# Patient Record
Sex: Male | Born: 1940 | Race: White | Hispanic: No | Marital: Married | Smoking: Former smoker
Health system: Southern US, Community
[De-identification: ages and names within clinical notes are randomized; demographics above are authoritative.]

## PROBLEM LIST (undated history)

## (undated) DIAGNOSIS — E785 Hyperlipidemia, unspecified: Secondary | ICD-10-CM

## (undated) DIAGNOSIS — I2510 Atherosclerotic heart disease of native coronary artery without angina pectoris: Secondary | ICD-10-CM

## (undated) DIAGNOSIS — Z720 Tobacco use: Secondary | ICD-10-CM

## (undated) DIAGNOSIS — I213 ST elevation (STEMI) myocardial infarction of unspecified site: Secondary | ICD-10-CM

## (undated) DIAGNOSIS — I10 Essential (primary) hypertension: Secondary | ICD-10-CM

## (undated) DIAGNOSIS — Z95820 Peripheral vascular angioplasty status with implants and grafts: Secondary | ICD-10-CM

## (undated) DIAGNOSIS — J449 Chronic obstructive pulmonary disease, unspecified: Secondary | ICD-10-CM

## (undated) DIAGNOSIS — Z9861 Coronary angioplasty status: Secondary | ICD-10-CM

## (undated) DIAGNOSIS — C801 Malignant (primary) neoplasm, unspecified: Secondary | ICD-10-CM

## (undated) DIAGNOSIS — C61 Malignant neoplasm of prostate: Secondary | ICD-10-CM

## (undated) HISTORY — DX: Atherosclerotic heart disease of native coronary artery without angina pectoris: I25.10

## (undated) HISTORY — DX: Coronary angioplasty status: Z98.61

## (undated) HISTORY — DX: Chronic obstructive pulmonary disease, unspecified: J44.9

## (undated) HISTORY — DX: Malignant neoplasm of prostate: C61

## (undated) HISTORY — DX: ST elevation (STEMI) myocardial infarction of unspecified site: I21.3

## (undated) HISTORY — DX: Hyperlipidemia, unspecified: E78.5

---

## 2000-03-20 ENCOUNTER — Encounter: Payer: Self-pay | Admitting: *Deleted

## 2000-03-20 ENCOUNTER — Encounter: Admission: RE | Admit: 2000-03-20 | Discharge: 2000-03-20 | Payer: Self-pay | Admitting: *Deleted

## 2003-09-06 DIAGNOSIS — C61 Malignant neoplasm of prostate: Secondary | ICD-10-CM

## 2003-09-06 HISTORY — DX: Malignant neoplasm of prostate: C61

## 2003-12-15 ENCOUNTER — Encounter (INDEPENDENT_AMBULATORY_CARE_PROVIDER_SITE_OTHER): Payer: Self-pay | Admitting: *Deleted

## 2003-12-15 ENCOUNTER — Inpatient Hospital Stay (HOSPITAL_COMMUNITY): Admission: RE | Admit: 2003-12-15 | Discharge: 2003-12-18 | Payer: Self-pay | Admitting: Urology

## 2003-12-15 HISTORY — PX: PROSTATECTOMY: SHX69

## 2003-12-23 ENCOUNTER — Ambulatory Visit (HOSPITAL_COMMUNITY): Admission: RE | Admit: 2003-12-23 | Discharge: 2003-12-23 | Payer: Self-pay | Admitting: Urology

## 2004-02-12 ENCOUNTER — Encounter: Admission: RE | Admit: 2004-02-12 | Discharge: 2004-02-12 | Payer: Self-pay | Admitting: Internal Medicine

## 2004-03-29 ENCOUNTER — Encounter: Admission: RE | Admit: 2004-03-29 | Discharge: 2004-03-29 | Payer: Self-pay | Admitting: Internal Medicine

## 2005-09-05 HISTORY — PX: COLONOSCOPY: SHX174

## 2009-06-02 ENCOUNTER — Ambulatory Visit: Admission: RE | Admit: 2009-06-02 | Discharge: 2009-08-31 | Payer: Self-pay | Admitting: Radiation Oncology

## 2009-06-08 LAB — CREATININE, SERUM: Creatinine, Ser: 1.13 mg/dL (ref 0.40–1.50)

## 2009-06-10 ENCOUNTER — Ambulatory Visit (HOSPITAL_COMMUNITY): Admission: RE | Admit: 2009-06-10 | Discharge: 2009-06-10 | Payer: Self-pay | Admitting: Radiation Oncology

## 2011-01-21 NOTE — Op Note (Signed)
NAME:  DANELL, VERNO                    ACCOUNT NO.:  [id number]   MEDICAL RECORD NO.:  [id number]                   PATIENT TYPE:  INP   LOCATION:  0373                                 FACILITY:  [facility]   PHYSICIAN:  Mark C. Vernie Ammons, M.D.               DATE OF BIRTH:  1941-02-01   DATE OF PROCEDURE:  12/15/2003  DATE OF DISCHARGE:                                 OPERATIVE REPORT   PREOPERATIVE DIAGNOSIS:  Prostate cancer.   POSTOPERATIVE DIAGNOSIS:  Prostate cancer.   PROCEDURE PERFORMED:  Nerve-sparing radical retropubic prostatectomy with  bilateral pelvic lymph node dissection.   SURGEON:  Mark C. Vernie Ammons, M.D.   ASSISTANT:  Thyra Breed, MD   ANESTHESIA:  General endotracheal.   ESTIMATED BLOOD LOSS:  850 cc.   DRAINS:  A 22 French Foley catheter straight drain and a 10 [place] Blake  drain to bulb-suction.   COMPLICATIONS:  None.   INDICATIONS FOR PROCEDURE:  Mr. Maiolo is a very pleasant 70 year old  male evaluated for an elevated PSA found on a routine physical examination  in January, 2005.  Specifically, the level was found to be 5.03.  The  patient denies any history of lower urinary tract symptoms.  The patient  denies any erectile dysfunction; however, he and his wife have not been  sexually active for the past 10 years.  He has no issues with incontinence.   PHYSICAL EXAMINATION:  The patient did have a palpable prostatic nodule at  the right apex; therefore, a transrectal ultrasound-guided prostate biopsy  was performed, which provided evidence of a Gleason 3+3=6 adenocarcinoma of  the prostate in 50% of the tissue from the right.  From the left, a Gleason  3+4=7 adenocarcinoma of the prostate involving approximately 50%.  Perineural invasion was identified.  Several treatment plans were discussed  with the patient, including radiation, implantation of radioactive seeds as  well as surgical therapy.  The patient has elected to proceed with radical  retropubic nerve-sparing prostatectomy.  The risks, benefits and  alternatives of the procedure have been explained to the patient, and he is  willing to proceed.   PROCEDURE IN DETAIL:  Following identification of his arm bracelet, the  patient was brought to the operating room and placed in the supine position.  The patient received preoperative IV antibiotics and underwent general  endotracheal anesthesia.  Bilateral sequential compression stockings were  applied, and a bump was placed under the low back.  The lower abdomen was  then shaved.  This area was then prepped with a Betadine solution and draped  in the usual sterile fashion.  A 20 French Foley catheter was then inserted,  and the bladder was drained.  An initial incision was made with the scalpel  in the midline from the pubis to just below the umbilicus.  This was carried  down through the subcutaneous tissues to the level of the fascia.  Any  subcutaneous bleeding  was controlled with Bovie electrocautery.  The scalpel  was then used to incise the anterior rectus fascia.  The rectus muscle was  then identified.  An attempt was made to part the muscle in the midline;  however, a portion of the rectus muscle was transected from its tendinis  attachment at the pubis on the right.  The transversalis fascia was then  opened, and blunt dissection was used to expose both the right and the left  pelvic fossa.  A malleable retractor was then used for visualization within  the right iliac fossa.  The malleable retractor was attached to the self-  retaining Bookwalter retractor.  We then began our dissection of the right  side of the lymph node packet, which was initiated over the right iliac  vein.  The rectal lymph node dissection was then completed, using Metzenbaum  scissors and DeBakey forceps with the limits of the dissection being the  external iliac vein, the obturator nerve, the circumflex iliac vein, and the  bifurcation of  the iliac artery.  Large metal hemalock clips were then used  to control vascular and lymphatic channels.  The obturator nerve was  protected during the procedure.  There was no obvious gross nodal disease.  A Ray-Tec sponge was used to pack the right iliac fossa following removal of  the right-sided lymph node packet.  The malleable retractor blades were then  reset, and the left-sided node dissection was performed in a similar fashion  once again using hemalock clips for control of any lymphatic vascular  channels.  Once the dissections were complete, and there was no obvious  gross nodal disease, we began our dissection of the prostate.  The  retractors were then readjusted.  Using a hand-held Stamey retractor, the  endopelvic fascia was easily identified and punctured on the right.  A right  angle was used to dissect the lateral aspect of the prostate free from the  overlying endopelvic fascia.  Blunt dissection was then used to further free  the lateral aspect of the right prostate.  We then punctured the endopelvic  fascia on the left, and in a similar fashion using the right angle and blunt  dissection, freed the left lateral aspect of the prostate.  The fat  overlying the dorsal vein complex was then teased away using the Kitner  separate dissection.  The puboprostatic ligaments were then easily  visualized at their attachment to the pubis.  They were then taken down  sharply using scissors at their attachment to the pubis laterally.  Those  ligaments within the midline were left intact.  The McDougall clamp was  placed beneath the dorsal vein complex.  A #1 Vicryl tie was then passed and  tied around the dorsal vein complex.  The McDougall clamp was again passed  in the same plane, and a second #1 Vicryl tie was placed and used to doubly  ligate the dorsal vein complex.  Bovie electrocautery was then used to begin  transection of the dorsal vein complex.  This produced minimal  back- bleeding.  The back-bleeding was controlled using a 2-0 chromic running  suture in a U-shaped fashion at the entry of the dorsal vein complex and at  to the prostatic specimen.  This provided excellent hemostasis from back-  bleeding.  The dorsal vein complex was then further transected.  This  revealed a lower portion of the complex, which continued to bleed.  Therefore, a #2-0 Vicryl on a UR5 needle was  used to over-sew any remaining  bleeding from the dorsal vein complex.  When this was controlled, the apex  of the prostate could be seen easily exposed.  Again, there were a couple of  nodules on the right apex of the prostate.  Metzenbaum scissors were then  spread on either side of the urethra to dissect the neurovascular bundles  from the urethra laterally on each side.  A right angle clamp was then  passed beneath the urethra, and a moistened umbilical tape was used to  elevate the urethra away from the neurovascular bundles.  The anterior  urethra was then divided using a scalpel.  The Foley catheter was then  lubricated and pulled into the wound.  The catheter was then divided and  used to provide cephalad traction on the prostate.  The posterior urethra  was then divided, and the rectourethralis attachments were taken down  bluntly.  There was a small amount of posterior urethra remaining, which was  further transected using Metzenbaum scissors.  The prostate was then  dissected off the rectum bluntly.  The lateral pedicles were then taken  down, using large right angle metal clips.  Once the prostate had been  sufficiently elevated, the anterior leaf of Denonvilliers fascia was excised  overlying the seminal vesicles.  The ampullae of the vas were identified,  dissected out, and ligated using large clips.  Similarly, the seminal  vesicles were dissected out and ligated using large clips.  We then turned  our attention anteriorly where the bladder neck was grasped between  two  Allis clamps.  Using a tonsil clamp at the Bovie, we dissected off the  bladder neck fibers.  The dissection was quite nice, and an excellent plane  between the prostate and the bladder was developed.  The anterior bladder  neck was then divided.  The Foley catheter balloon deflated, and the Foley  catheter brought from the bladder and used to provide traction on the  prostate.  Both ureteral orifices were then identified and seemed to be  effluxing blue urine from the previous administration of indigo carmine.  The posterior bladder neck was then divided along with any remaining  posterior prostatic attachments.  The prostatic specimen was then passed  from the surgical field.  The bladder neck mucosa was then everted using  interrupted 4-0 chromic stitches.  Care was taken to remain well away from  the ureteral orifices.  The residual bladder neck was large enough to admit  only the tip of the fifth surgeon's finger.  At this time, there was some additional bleeding noted near the urethra.  In fact, it appeared to be the  dorsal-most aspect of the dorsal vein complex; therefore, a 2-0 chromic  suture was used in a figure-of-eight fashion to provide excellent hemostasis  of any remaining bleeding in this area.  A Greenwald device was placed in  the urethra, and anastomotic sutures with 2-0 PDS were placed from the  urethral stump to the bladder neck at the 2, 5, 7, and 10 o'clock positions.  A 22 French Foley catheter was then placed through the bladder neck and  guided into the bladder.  The balloon was inflated with approximately 15 ml  of sterile fluid.  We then placed the 12 o'clock anastomotic suture from the  urethral stump to the bladder neck.  It should be mentioned that during the  final dissection of the prostate, a 22 French Foley catheter was inserted  into the urethra,  and traction was used against the urethral stump to  control any bleeding.  At this point, the malleable  retractor was removed,  and the bladder neck was positioned firmly against the urethral stump.  Any  slack in the anastomotic sutures was removed.  While tying the first  anastomotic suture, the suture broke; therefore, the slack was brought back  to the anastomosis, and a new suture placed.  Again, the bladder neck was  positioned firmly against the urethral stump, and all anastomotic sutures  were tied and trimmed.  At this point, the bladder was irrigated, and the  anastomosis was found to be water-tight.  There was no bleeding seen within  the bladder.  A #10 flat fluted Blake drain was then placed under a separate  stab wound lateral to the right side of the abdominal incision.  This was  positioned over the anastomotic area in the pelvic fossa.  All laps and Ray-  Tek sponges were removed from the operative field.  By this time, all  sponge, needle, and instrument counts were correct x2.  The wound was then  copiously irrigated with sterile saline.  The fascia was then closed with a  #1 running PDS suture.  The previously transected belly of the right rectus  muscle was then carefully positioned below the fascia.  The subcutaneous  tissues were then irrigated with sterile saline, and the skin was closed  with surgical clips.  The Foley catheter was irrigated once again, and no  bleeding was noted.  The catheter was placed to straight drain and then on  traction and the J-P drain to bulb-suction.  The patient tolerated the  procedure well, and there were no complications.   Dr. Domenic Schwab was present and participated in the entire procedure, as he  was the responsible surgeon.   DISPOSITION:  After awaking from general anesthesia, the patient was  transported to the post anesthesia care unit in stable condition.  From  here, he will be transferred to the floor for further postoperative  management.     Thyra Breed, MD                            Veverly Fells. Vernie Ammons, M.D.     EG/MEDQ  D:  12/15/2003  T:  12/15/2003  Job:  [id number]

## 2011-01-21 NOTE — Discharge Summary (Signed)
NAME:  Angel West, Angel West                    ACCOUNT NO.:  [id number]   MEDICAL RECORD NO.:  [id number]                   PATIENT TYPE:  INP   LOCATION:  0373                                 FACILITY:  [facility]   PHYSICIAN:  Mark C. Vernie Ammons, M.D.               DATE OF BIRTH:  November 28, 1940   DATE OF ADMISSION:  12/15/2003  DATE OF DISCHARGE:  12/18/2003                                 DISCHARGE SUMMARY   PRIMARY DIAGNOSIS:  Adenocarcinoma of the prostate.   OTHER DIAGNOSES:  1. History of coronary artery disease.  2. Kidney stones.   MAJOR OPERATION:  Radical retropubic prostatectomy, bilateral lymph node  dissection.  The pathology revealed a pT2c, N0, MX Gleason 7 lesion that had  negative margins and negative nodes.   DISPOSITION:  The patient is discharged home in stable, satisfactory, and  improved condition.  He is tolerating a regular diet and has had bowel  movement.  His follow up will be in my office in 1-week for skin staple  removal.  His activity will be limited to no heavy lifting, straining,  driving, or vigorous activity.   DISCHARGE MEDICATIONS:  1. Tylox one to two q.4 h. p.r.n. pain, dispense #40.  2. Cipro 250 mg b.i.d., dispense #18.   BRIEF HISTORY:  The patient is a 70 year old white male who was found to  have an elevated PSA of 5.03.  Subsequent biopsy of his prostate revealed  adenocarcinoma Gleason 7.  We discussed treatment options and he elected for  radical retropubic prostatectomy and was admitted for that.  Full History  and Physical was previously dictated and it noted in the patient's hospital  chart.   HOSPITAL COURSE:  The patient was admitted and underwent a radical  retropubic prostatectomy and bilateral pelvic lymph node dissection without  complication.  The night of surgery was noted to be doing well, slight blood  tinge to the urine, no clots, and overall progressed without difficulty.  That night of his surgery a T-max of 101.2.   Pulmonary toilet was  encouraged.  His drain showed decrease in output and H&H was 9.8 and 28.4.  He did have decreased breath sounds in both bases consistent with mild  atelectasis.  He was started on a clear liquid diet and advanced to regular.  He had a single episode of emesis with some coffee-ground consistency.  I  started him on some Protonix IV and then switched that to oral.  He had no  further nausea and vomiting from that point on.  By the second postop day,  his incision was looking good with no sign of infection.  His PCA pump was  stopped and he was started on an oral pain medication.  He was given  laxatives and diet was advanced and well tolerated.  By his third postop  day, he appeared to be doing well with no sign of infection.  His drain has  minimal  output and was therefore removed.  I discussed his pathology report  with him and he is felt ready for discharge at this time.                                               Mark C. Vernie Ammons, M.D.    MCO/MEDQ  D:  12/18/2003  T:  12/18/2003  Job:  [id number]

## 2011-07-08 ENCOUNTER — Ambulatory Visit
Admission: RE | Admit: 2011-07-08 | Discharge: 2011-07-08 | Disposition: A | Discharge status: Home / Self Care | Payer: Medicare Other | Source: Ambulatory Visit | Attending: Internal Medicine | Admitting: Internal Medicine

## 2011-07-08 ENCOUNTER — Other Ambulatory Visit: Payer: Self-pay | Admitting: Internal Medicine

## 2011-07-08 DIAGNOSIS — R0602 Shortness of breath: Secondary | ICD-10-CM

## 2011-07-08 DIAGNOSIS — R05 Cough: Secondary | ICD-10-CM

## 2011-07-25 ENCOUNTER — Other Ambulatory Visit (HOSPITAL_COMMUNITY): Payer: Self-pay | Admitting: Internal Medicine

## 2011-07-25 DIAGNOSIS — R0602 Shortness of breath: Secondary | ICD-10-CM

## 2011-08-04 ENCOUNTER — Ambulatory Visit (HOSPITAL_COMMUNITY)
Admission: RE | Admit: 2011-08-04 | Discharge: 2011-08-04 | Disposition: A | Discharge status: Home / Self Care | Payer: Medicare Other | Source: Ambulatory Visit | Attending: Internal Medicine | Admitting: Internal Medicine

## 2011-08-04 DIAGNOSIS — R0602 Shortness of breath: Secondary | ICD-10-CM | POA: Insufficient documentation

## 2011-08-24 ENCOUNTER — Encounter (HOSPITAL_COMMUNITY): Payer: Self-pay | Admitting: *Deleted

## 2011-08-25 ENCOUNTER — Ambulatory Visit (HOSPITAL_COMMUNITY)
Admission: RE | Admit: 2011-08-25 | Discharge: 2011-08-25 | Disposition: A | Discharge status: Home / Self Care | Payer: Medicare Other | Source: Ambulatory Visit | Attending: Gastroenterology | Admitting: Gastroenterology

## 2011-08-25 ENCOUNTER — Encounter (HOSPITAL_COMMUNITY): Payer: Self-pay | Admitting: *Deleted

## 2011-08-25 ENCOUNTER — Encounter (HOSPITAL_COMMUNITY)
Admission: RE | Disposition: A | Discharge status: Home / Self Care | Payer: Self-pay | Source: Ambulatory Visit | Attending: Gastroenterology

## 2011-08-25 DIAGNOSIS — I2510 Atherosclerotic heart disease of native coronary artery without angina pectoris: Secondary | ICD-10-CM | POA: Insufficient documentation

## 2011-08-25 DIAGNOSIS — K222 Esophageal obstruction: Secondary | ICD-10-CM | POA: Insufficient documentation

## 2011-08-25 DIAGNOSIS — Z79899 Other long term (current) drug therapy: Secondary | ICD-10-CM | POA: Insufficient documentation

## 2011-08-25 DIAGNOSIS — K219 Gastro-esophageal reflux disease without esophagitis: Secondary | ICD-10-CM | POA: Insufficient documentation

## 2011-08-25 DIAGNOSIS — I10 Essential (primary) hypertension: Secondary | ICD-10-CM | POA: Insufficient documentation

## 2011-08-25 DIAGNOSIS — F17200 Nicotine dependence, unspecified, uncomplicated: Secondary | ICD-10-CM | POA: Insufficient documentation

## 2011-08-25 DIAGNOSIS — Z8546 Personal history of malignant neoplasm of prostate: Secondary | ICD-10-CM | POA: Insufficient documentation

## 2011-08-25 DIAGNOSIS — E7800 Pure hypercholesterolemia, unspecified: Secondary | ICD-10-CM | POA: Insufficient documentation

## 2011-08-25 DIAGNOSIS — Z9861 Coronary angioplasty status: Secondary | ICD-10-CM | POA: Insufficient documentation

## 2011-08-25 DIAGNOSIS — R1310 Dysphagia, unspecified: Secondary | ICD-10-CM | POA: Insufficient documentation

## 2011-08-25 DIAGNOSIS — K449 Diaphragmatic hernia without obstruction or gangrene: Secondary | ICD-10-CM | POA: Insufficient documentation

## 2011-08-25 HISTORY — DX: Malignant (primary) neoplasm, unspecified: C80.1

## 2011-08-25 HISTORY — DX: Essential (primary) hypertension: I10

## 2011-08-25 HISTORY — PX: ESOPHAGOGASTRODUODENOSCOPY: SHX5428

## 2011-08-25 HISTORY — PX: BALLOON DILATION: SHX5330

## 2011-08-25 SURGERY — EGD (ESOPHAGOGASTRODUODENOSCOPY)
Anesthesia: Moderate Sedation

## 2011-08-25 MED ORDER — SODIUM CHLORIDE 0.9 % IV SOLN
Freq: Once | INTRAVENOUS | Status: AC
  Administered 2011-08-25: 500 mL via INTRAVENOUS

## 2011-08-25 MED ORDER — MIDAZOLAM HCL 10 MG/2ML IJ SOLN
INTRAMUSCULAR | Status: AC
  Filled 2011-08-25: qty 2

## 2011-08-25 MED ORDER — FENTANYL CITRATE 0.05 MG/ML IJ SOLN
INTRAMUSCULAR | Status: AC
  Filled 2011-08-25: qty 2

## 2011-08-25 MED ORDER — BUTAMBEN-TETRACAINE-BENZOCAINE 2-2-14 % EX AERO
INHALATION_SPRAY | CUTANEOUS | Status: DC | PRN
  Administered 2011-08-25: 1 via TOPICAL

## 2011-08-25 MED ORDER — OMEPRAZOLE 20 MG PO CPDR: 20.0000 mg | DELAYED_RELEASE_CAPSULE | Freq: Every day | ORAL | Status: DC

## 2011-08-25 MED ORDER — FENTANYL NICU IV SYRINGE 50 MCG/ML
INJECTION | INTRAMUSCULAR | Status: DC | PRN
  Administered 2011-08-25: 25 ug via INTRAVENOUS
  Administered 2011-08-25: 50 ug via INTRAVENOUS

## 2011-08-25 MED ORDER — MIDAZOLAM HCL 10 MG/2ML IJ SOLN
INTRAMUSCULAR | Status: DC | PRN
  Administered 2011-08-25: 2 mg via INTRAVENOUS
  Administered 2011-08-25 (×2): 2.5 mg via INTRAVENOUS

## 2011-08-25 NOTE — Op Note (Signed)
Procedure: Diagnostic esophagogastroduodenoscopy with balloon dilation of a benign stricture at the esophagogastric junction.  Endoscopist: Danise Edge  Premedication: Fentanyl 75 mcg intravenously. Versed 7 mg intravenously.  Indications: Angel West is a 69 year old male born 05/13/1941.  The patient has a past history of hypercholesterolemia, hypertension, coronary artery disease, chronic cigarette smoking, prostate cancer, and coronary angioplasty. In 2007, he underwent a normal screening colonoscopy.  The patient reports intermittent solid food esophageal dysphagia associated with chronic gastroesophageal reflux occurring over the past 30 years. His episodes of solid food dysphasia are becoming more frequent. He takes generic Zantac on a daily basis to control heartburn.  Patient is scheduled to undergo diagnostic esophagogastroduodenoscopy with balloon dilation of a distal esophageal stricture  Procedure: The patient was placed in the left lateral decubitus position. The Pentax gastroscope was passed through the posterior hypopharynx into the proximal esophagus without difficulty. The hypopharynx, larynx, and vocal cords appeared normal.  Esophagoscopy: The proximal and mid segments of the esophageal mucosa appear normal. 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 There is no endoscopic evidence for the presence of erosive esophagitis or Barrett's esophagus. The estimated diameter of the esophageal stricture is approximately 10 mm. Using the esophageal balloon dilator, the benign distal esophageal stricture was dilated to 15 mm without apparent complications.  Gastroscopy: There is a moderate sized hiatal hernia. Retroflexed view of the gastric cardia and fundus was normal. The diaphragmatic hiatus is patulous. The gastric body, antrum, and pylorus appeared normal.  Duodenoscopy: The duodenal bulb and  descending duodenum appeared normal.  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 There is no evidence for the presence of erosive esophagitis or Barrett's esophagus.  Recommendations: Discontinue Zantac and start omeprazole 20 mg before breakfast each morning.

## 2011-08-25 NOTE — H&P (Signed)
History: Mr. Angel West is a 70 year old male born Sep 21, 1940. The patient has a past history of hypercholesterolemia, hypertension, coronary artery disease, chronic cigarette smoking, prostate cancer, and coronary angioplasty. In 2007, he underwent a normal screening colonoscopy.  The patient reports intermittent solid food esophageal dysphagia associated with chronic gastroesophageal reflux for over 30 years. His episodes of solid food dysphagia are becoming more frequent. He takes generic Zantac on a daily basis to control heartburn.  Current medications: Zantac and Lipitor  Past medical history: Gastroesophageal reflux. Hypercholesterolemia. Hypertension. Coronary artery disease. Chronic cigarette smoking. Prostate cancer. Coronary angioplasty. Normal screening colonoscopy in 2007.  Exam: The patient is alert and lying comfortably on the stretcher in the endoscopy suite. Sclera and conjunctiva appeared normal. The pupils are dilated due to his ophthalmological  exam performed earlier today. Mouth and throat appear normal. Lungs are clear to auscultation. Cardiac exam reveals a regular rhythm without audible murmurs. Abdomen is soft, flat, and nontender to palpation in all quadrants.  Plan: Proceed with a diagnostic esophagogastroduodenoscopy with possible balloon esophageal dilation of a distal esophageal stricture.

## 2011-08-26 ENCOUNTER — Encounter (HOSPITAL_COMMUNITY): Payer: Self-pay

## 2011-08-26 ENCOUNTER — Encounter (HOSPITAL_COMMUNITY): Payer: Self-pay | Admitting: Gastroenterology

## 2014-03-19 ENCOUNTER — Other Ambulatory Visit: Payer: Self-pay | Admitting: Gastroenterology

## 2014-04-08 ENCOUNTER — Inpatient Hospital Stay (HOSPITAL_COMMUNITY): Payer: Medicare Other

## 2014-04-08 ENCOUNTER — Encounter (HOSPITAL_COMMUNITY): Payer: Self-pay | Admitting: Emergency Medicine

## 2014-04-08 ENCOUNTER — Encounter (HOSPITAL_COMMUNITY)
Admission: EM | Disposition: A | Discharge status: Home / Self Care | Payer: Medicare Other | Source: Home / Self Care | Attending: Interventional Cardiology

## 2014-04-08 ENCOUNTER — Inpatient Hospital Stay (HOSPITAL_COMMUNITY)
Admission: EM | Admit: 2014-04-08 | Discharge: 2014-04-10 | DRG: 249 | Disposition: A | Discharge status: Home / Self Care | Payer: Medicare Other | Attending: Interventional Cardiology | Admitting: Interventional Cardiology

## 2014-04-08 DIAGNOSIS — Z87891 Personal history of nicotine dependence: Secondary | ICD-10-CM | POA: Diagnosis not present

## 2014-04-08 DIAGNOSIS — I472 Ventricular tachycardia, unspecified: Secondary | ICD-10-CM | POA: Diagnosis present

## 2014-04-08 DIAGNOSIS — Z22322 Carrier or suspected carrier of Methicillin resistant Staphylococcus aureus: Secondary | ICD-10-CM

## 2014-04-08 DIAGNOSIS — E7800 Pure hypercholesterolemia, unspecified: Secondary | ICD-10-CM

## 2014-04-08 DIAGNOSIS — I4729 Other ventricular tachycardia: Secondary | ICD-10-CM

## 2014-04-08 DIAGNOSIS — I10 Essential (primary) hypertension: Secondary | ICD-10-CM

## 2014-04-08 DIAGNOSIS — I213 ST elevation (STEMI) myocardial infarction of unspecified site: Secondary | ICD-10-CM

## 2014-04-08 DIAGNOSIS — R1310 Dysphagia, unspecified: Secondary | ICD-10-CM | POA: Diagnosis present

## 2014-04-08 DIAGNOSIS — I2582 Chronic total occlusion of coronary artery: Secondary | ICD-10-CM | POA: Diagnosis present

## 2014-04-08 DIAGNOSIS — I2510 Atherosclerotic heart disease of native coronary artery without angina pectoris: Secondary | ICD-10-CM | POA: Diagnosis present

## 2014-04-08 DIAGNOSIS — I2119 ST elevation (STEMI) myocardial infarction involving other coronary artery of inferior wall: Secondary | ICD-10-CM | POA: Diagnosis present

## 2014-04-08 DIAGNOSIS — Z720 Tobacco use: Secondary | ICD-10-CM | POA: Diagnosis present

## 2014-04-08 DIAGNOSIS — Z955 Presence of coronary angioplasty implant and graft: Secondary | ICD-10-CM

## 2014-04-08 DIAGNOSIS — Z951 Presence of aortocoronary bypass graft: Secondary | ICD-10-CM | POA: Diagnosis not present

## 2014-04-08 DIAGNOSIS — R079 Chest pain, unspecified: Secondary | ICD-10-CM | POA: Diagnosis present

## 2014-04-08 DIAGNOSIS — I252 Old myocardial infarction: Secondary | ICD-10-CM

## 2014-04-08 DIAGNOSIS — E785 Hyperlipidemia, unspecified: Secondary | ICD-10-CM | POA: Diagnosis present

## 2014-04-08 DIAGNOSIS — Z95820 Peripheral vascular angioplasty status with implants and grafts: Secondary | ICD-10-CM

## 2014-04-08 HISTORY — DX: Peripheral vascular angioplasty status with implants and grafts: Z95.820

## 2014-04-08 HISTORY — PX: CORONARY STENT PLACEMENT: SHX1402

## 2014-04-08 HISTORY — DX: Tobacco use: Z72.0

## 2014-04-08 HISTORY — DX: ST elevation (STEMI) myocardial infarction of unspecified site: I21.3

## 2014-04-08 HISTORY — PX: LEFT HEART CATHETERIZATION WITH CORONARY ANGIOGRAM: SHX5451

## 2014-04-08 LAB — POCT I-STAT, CHEM 8
BUN: 13 mg/dL (ref 6–23)
CALCIUM ION: 1.12 mmol/L — AB (ref 1.13–1.30)
Chloride: 110 mEq/L (ref 96–112)
Creatinine, Ser: 1.2 mg/dL (ref 0.50–1.35)
GLUCOSE: 100 mg/dL — AB (ref 70–99)
HCT: 42 % (ref 39.0–52.0)
Hemoglobin: 14.3 g/dL (ref 13.0–17.0)
Potassium: 3.5 mEq/L — ABNORMAL LOW (ref 3.7–5.3)
Sodium: 142 mEq/L (ref 137–147)
TCO2: 23 mmol/L (ref 0–100)

## 2014-04-08 LAB — BASIC METABOLIC PANEL
Anion gap: 15 (ref 5–15)
BUN: 14 mg/dL (ref 6–23)
CO2: 24 mEq/L (ref 19–32)
Calcium: 9.3 mg/dL (ref 8.4–10.5)
Chloride: 104 mEq/L (ref 96–112)
Creatinine, Ser: 1.12 mg/dL (ref 0.50–1.35)
GFR, EST AFRICAN AMERICAN: 72 mL/min — AB (ref >90)
GFR, EST NON AFRICAN AMERICAN: 62 mL/min — AB (ref >90)
GLUCOSE: 95 mg/dL (ref 70–99)
POTASSIUM: 4 meq/L (ref 3.7–5.3)
SODIUM: 143 meq/L (ref 137–147)

## 2014-04-08 LAB — PROTIME-INR
INR: 0.99 (ref 0.00–1.49)
Prothrombin Time: 13.1 seconds (ref 11.6–15.2)

## 2014-04-08 LAB — TROPONIN I: Troponin I: 18.01 ng/mL (ref <0.30)

## 2014-04-08 LAB — CBC
HCT: 44.3 % (ref 39.0–52.0)
HEMOGLOBIN: 15.6 g/dL (ref 13.0–17.0)
MCH: 32.6 pg (ref 26.0–34.0)
MCHC: 35.2 g/dL (ref 30.0–36.0)
MCV: 92.5 fL (ref 78.0–100.0)
Platelets: 158 10*3/uL (ref 150–400)
RBC: 4.79 MIL/uL (ref 4.22–5.81)
RDW: 12.7 % (ref 11.5–15.5)
WBC: 9.6 10*3/uL (ref 4.0–10.5)

## 2014-04-08 LAB — PRO B NATRIURETIC PEPTIDE: PRO B NATRI PEPTIDE: 140.6 pg/mL (ref 0–450)

## 2014-04-08 LAB — POCT ACTIVATED CLOTTING TIME
Activated Clotting Time: 281 seconds
Activated Clotting Time: 259 seconds

## 2014-04-08 LAB — CK TOTAL AND CKMB (NOT AT ARMC)
CK, MB: 40.8 ng/mL (ref 0.3–4.0)
Relative Index: 12 — ABNORMAL HIGH (ref 0.0–2.5)
Total CK: 339 U/L — ABNORMAL HIGH (ref 7–232)

## 2014-04-08 LAB — POCT I-STAT TROPONIN I: Troponin i, poc: 0.08 ng/mL (ref 0.00–0.08)

## 2014-04-08 LAB — APTT: aPTT: 30 seconds (ref 24–37)

## 2014-04-08 LAB — MRSA PCR SCREENING: MRSA BY PCR: POSITIVE — AB

## 2014-04-08 SURGERY — LEFT HEART CATHETERIZATION WITH CORONARY ANGIOGRAM
Anesthesia: LOCAL

## 2014-04-08 MED ORDER — ASPIRIN EC 81 MG PO TBEC
81.0000 mg | DELAYED_RELEASE_TABLET | Freq: Every day | ORAL | Status: DC
  Administered 2014-04-09 – 2014-04-10 (×2): 81 mg via ORAL
  Filled 2014-04-08 (×2): qty 1

## 2014-04-08 MED ORDER — ACETAMINOPHEN 325 MG PO TABS: 650.0000 mg | ORAL_TABLET | ORAL | Status: DC | PRN

## 2014-04-08 MED ORDER — TICAGRELOR 90 MG PO TABS
ORAL_TABLET | ORAL | Status: AC
  Filled 2014-04-08: qty 2

## 2014-04-08 MED ORDER — TICAGRELOR 90 MG PO TABS
90.0000 mg | ORAL_TABLET | Freq: Two times a day (BID) | ORAL | Status: DC
  Administered 2014-04-09 – 2014-04-10 (×3): 90 mg via ORAL
  Filled 2014-04-08 (×4): qty 1

## 2014-04-08 MED ORDER — CHLORHEXIDINE GLUCONATE CLOTH 2 % EX PADS
6.0000 | MEDICATED_PAD | Freq: Every day | CUTANEOUS | Status: DC
  Administered 2014-04-09 – 2014-04-10 (×2): 6 via TOPICAL

## 2014-04-08 MED ORDER — MIDAZOLAM HCL 2 MG/2ML IJ SOLN
INTRAMUSCULAR | Status: AC
Start: 2014-04-08 — End: 2014-04-08
  Filled 2014-04-08: qty 2

## 2014-04-08 MED ORDER — ONDANSETRON HCL 4 MG/2ML IJ SOLN: 4.0000 mg | Freq: Four times a day (QID) | INTRAMUSCULAR | Status: DC | PRN

## 2014-04-08 MED ORDER — HEPARIN SODIUM (PORCINE) 5000 UNIT/ML IJ SOLN
INTRAMUSCULAR | Status: AC
  Filled 2014-04-08: qty 1

## 2014-04-08 MED ORDER — SODIUM CHLORIDE 0.9 % IV SOLN
1.0000 mL/kg/h | INTRAVENOUS | Status: AC
  Administered 2014-04-08: 1 mL/kg/h via INTRAVENOUS

## 2014-04-08 MED ORDER — ASPIRIN 81 MG PO CHEW
324.0000 mg | CHEWABLE_TABLET | Freq: Once | ORAL | Status: AC
  Administered 2014-04-08: 324 mg via ORAL
  Filled 2014-04-08: qty 4

## 2014-04-08 MED ORDER — VITAMIN D3 25 MCG (1000 UNIT) PO TABS
1000.0000 [IU] | ORAL_TABLET | Freq: Every day | ORAL | Status: DC
  Administered 2014-04-09 – 2014-04-10 (×2): 1000 [IU] via ORAL
  Filled 2014-04-08 (×2): qty 1

## 2014-04-08 MED ORDER — SODIUM CHLORIDE 0.9 % IV SOLN: INTRAVENOUS | Status: DC

## 2014-04-08 MED ORDER — TICAGRELOR 90 MG PO TABS
90.0000 mg | ORAL_TABLET | Freq: Two times a day (BID) | ORAL | Status: DC
  Filled 2014-04-08: qty 1

## 2014-04-08 MED ORDER — HEPARIN SODIUM (PORCINE) 5000 UNIT/ML IJ SOLN
4000.0000 [IU] | INTRAMUSCULAR | Status: AC
  Administered 2014-04-08: 4000 [IU] via INTRAVENOUS

## 2014-04-08 MED ORDER — NITROGLYCERIN 0.4 MG SL SUBL: 0.4000 mg | SUBLINGUAL_TABLET | SUBLINGUAL | Status: DC | PRN

## 2014-04-08 MED ORDER — MUPIROCIN 2 % EX OINT
1.0000 "application " | TOPICAL_OINTMENT | Freq: Two times a day (BID) | CUTANEOUS | Status: DC
  Administered 2014-04-08 – 2014-04-10 (×4): 1 via NASAL
  Filled 2014-04-08: qty 22

## 2014-04-08 MED ORDER — METOPROLOL TARTRATE 12.5 MG HALF TABLET
12.5000 mg | ORAL_TABLET | Freq: Two times a day (BID) | ORAL | Status: DC
  Administered 2014-04-09: 12.5 mg via ORAL
  Filled 2014-04-08 (×3): qty 1

## 2014-04-08 MED ORDER — VERAPAMIL HCL 2.5 MG/ML IV SOLN
INTRAVENOUS | Status: AC
  Filled 2014-04-08: qty 2

## 2014-04-08 MED ORDER — ATORVASTATIN CALCIUM 80 MG PO TABS
80.0000 mg | ORAL_TABLET | Freq: Every day | ORAL | Status: DC
  Administered 2014-04-09 – 2014-04-10 (×2): 80 mg via ORAL
  Filled 2014-04-08 (×2): qty 1

## 2014-04-08 MED ORDER — TIOTROPIUM BROMIDE MONOHYDRATE 18 MCG IN CAPS
18.0000 ug | ORAL_CAPSULE | Freq: Every day | RESPIRATORY_TRACT | Status: DC
  Administered 2014-04-09 – 2014-04-10 (×2): 18 ug via RESPIRATORY_TRACT
  Filled 2014-04-08: qty 5

## 2014-04-08 MED ORDER — ONDANSETRON HCL 4 MG/2ML IJ SOLN
INTRAMUSCULAR | Status: AC
  Filled 2014-04-08: qty 2

## 2014-04-08 MED ORDER — ASPIRIN 81 MG PO CHEW: 81.0000 mg | CHEWABLE_TABLET | Freq: Every day | ORAL | Status: DC

## 2014-04-08 MED ORDER — FENTANYL CITRATE 0.05 MG/ML IJ SOLN
INTRAMUSCULAR | Status: AC
  Filled 2014-04-08: qty 2

## 2014-04-08 MED ORDER — LIDOCAINE HCL (PF) 1 % IJ SOLN
INTRAMUSCULAR | Status: AC
  Filled 2014-04-08: qty 30

## 2014-04-08 MED ORDER — TIROFIBAN HCL IV 5 MG/100ML
INTRAVENOUS | Status: AC
  Filled 2014-04-08: qty 100

## 2014-04-08 MED ORDER — ATROPINE SULFATE 0.1 MG/ML IJ SOLN
INTRAMUSCULAR | Status: AC
  Filled 2014-04-08: qty 10

## 2014-04-08 MED ORDER — PANTOPRAZOLE SODIUM 40 MG PO TBEC
40.0000 mg | DELAYED_RELEASE_TABLET | Freq: Every day | ORAL | Status: DC
  Administered 2014-04-09 – 2014-04-10 (×2): 40 mg via ORAL
  Filled 2014-04-08 (×2): qty 1

## 2014-04-08 MED ORDER — TIROFIBAN HCL IV 5 MG/100ML
0.1500 ug/kg/min | INTRAVENOUS | Status: AC
  Administered 2014-04-08: 0.15 ug/kg/min via INTRAVENOUS

## 2014-04-08 MED ORDER — ATORVASTATIN CALCIUM 80 MG PO TABS: 80.0000 mg | ORAL_TABLET | Freq: Every day | ORAL | Status: DC

## 2014-04-08 NOTE — ED Notes (Signed)
Called [facility] to page out STEMI

## 2014-04-08 NOTE — ED Provider Notes (Signed)
CSN: [id number]     Arrival date & time 04/08/14  1714 History   First MD Initiated Contact with Patient 04/08/14 1732     Chief Complaint  Patient presents with  . Chest Pain     (Consider location/radiation/quality/duration/timing/severity/associated sxs/prior Treatment) HPI 73 year old male with about 4-5 hours of gradual onset mild aching across his chest radiating towards both upper arms with some slight numbness to both upper arms with some mild shortness of breath as well with a heavy feeling in both upper arms with no cough no vomiting no diarrhea no bloody stools no abdominal pain no back pain no sudden pain no sharp pain no severe pain, no treatment prior to arrival, patient has history of coronary artery disease with angioplasty in 1980s and has not seen a cardiologist in at least 5 years and is unsure if he saw [place] or [place] and there is no comparison EKG or prior cardiac records in [facility]. Past Medical History  Diagnosis Date  . Hypercholesteremia   . Hypertension   . Coronary artery disease   . Cancer prostate  . Dysphagia   . Acute myocardial infarction of other inferior wall, initial episode of care   . Tobacco use 04/10/2014  . S/P angioplasty with stent mRCA with BMS- vision 04/10/2014  . CAD in native artery, mild non obstructive residual diseas 04/10/2014   Past Surgical History  Procedure Laterality Date  . Colonoscopy  2007  . Esophagogastroduodenoscopy  08/25/2011    Procedure: ESOPHAGOGASTRODUODENOSCOPY (EGD);  Surgeon: Garlan Fair, MD;  Location: [facility];  Service: Endoscopy;  Laterality: N/A;  . Balloon dilation  08/25/2011    Procedure: BALLOON DILATION;  Surgeon: Garlan Fair, MD;  Location: [facility];  Service: Endoscopy;  Laterality: N/A;   Family History  Problem Relation Age of Onset  . Anesthesia problems Neg Hx   . Malignant hyperthermia Neg Hx    History  Substance Use Topics  . Smoking status: Current Every Day Smoker -- 1.00  packs/day    Types: Cigars  . Smokeless tobacco: Not on file  . Alcohol Use: No    Review of Systems 10 Systems reviewed and are negative for acute change except as noted in the HPI.   Allergies  Review of patient's allergies indicates no known allergies.  Home Medications   Prior to Admission medications   Medication Sig Start Date End Date Taking? Authorizing Provider  BIOTIN PO Take 1 tablet by mouth daily.   Yes Historical Provider, MD  CALCIUM PO Take 1 tablet by mouth daily.   Yes Historical Provider, MD  cholecalciferol (VITAMIN D) 1000 UNITS tablet Take 1,000 Units by mouth daily.     Yes Historical Provider, MD  MAGNESIUM PO Take 1 tablet by mouth daily with supper.   Yes Historical Provider, MD  NON FORMULARY Take 1 tablet by mouth daily with supper. Formula one   Yes Historical Provider, MD  acetaminophen (TYLENOL) 325 MG tablet Take 2 tablets (650 mg total) by mouth every 4 (four) hours as needed for headache or mild pain. 04/10/14   Cecilie Kicks, NP  aspirin EC 81 MG EC tablet Take 1 tablet (81 mg total) by mouth daily. 04/10/14   Cecilie Kicks, NP  atorvastatin (LIPITOR) 80 MG tablet Take 1 tablet (80 mg total) by mouth daily at 6 PM. 04/10/14   Cecilie Kicks, NP  carvedilol (COREG) 3.125 MG tablet Take 1 tablet (3.125 mg total) by mouth 2 (two) times daily with a meal.  04/10/14   Cecilie Kicks, NP  nitroGLYCERIN (NITROSTAT) 0.4 MG SL tablet Place 1 tablet (0.4 mg total) under the tongue every 5 (five) minutes x 3 doses as needed for chest pain. 04/10/14   Cecilie Kicks, NP  pantoprazole (PROTONIX) 40 MG tablet Take 1 tablet (40 mg total) by mouth daily. 04/10/14   Cecilie Kicks, NP  ticagrelor (BRILINTA) 90 MG TABS tablet Take 1 tablet (90 mg total) by mouth 2 (two) times daily. 04/10/14   Cecilie Kicks, NP  tiotropium (SPIRIVA) 18 MCG inhalation capsule Place 1 capsule (18 mcg total) into inhaler and inhale daily. 04/10/14   Cecilie Kicks, NP   BP 120/70  Pulse 62  Temp(Src) 98.2 F (36.8  C) (Oral)  Resp 18  Ht 5\' 11"  (1.803 m)  Wt 208 lb 4.8 oz (94.484 kg)  BMI 29.06 kg/m2  SpO2 97% Physical Exam  Nursing note and vitals reviewed. Constitutional:  Awake, alert, nontoxic appearance.  HENT:  Head: Atraumatic.  Eyes: Right eye exhibits no discharge. Left eye exhibits no discharge.  Neck: Neck supple.  Cardiovascular: Normal rate and regular rhythm.   No murmur heard. Pulmonary/Chest: Effort normal and breath sounds normal. No respiratory distress. He has no wheezes. He has no rales. He exhibits no tenderness.  Abdominal: Soft. Bowel sounds are normal. He exhibits no distension. There is no tenderness. There is no rebound and no guarding.  Musculoskeletal: He exhibits no edema and no tenderness.  Baseline ROM, no obvious new focal weakness.  Neurological:  Mental status and motor strength appears baseline for patient and situation.  Skin: No rash noted.  Psychiatric: He has a normal mood and affect.    ED Course  Procedures (including critical care time) Code STEMI activated from Triage when Pt seen and no comparison ECG available. Initial ECG reviewed ~1725. D/w [place] Pt to Cath Lab.  CRITICAL CARE Performed by: Babette Relic Total critical care time: 84min Critical care time was exclusive of separately billable procedures and treating other patients. Critical care was necessary to treat or prevent imminent or life-threatening deterioration. Critical care was time spent personally by me on the following activities: development of treatment plan with patient and/or surrogate as well as nursing, discussions with consultants, evaluation of patient's response to treatment, examination of patient, obtaining history from patient or surrogate, ordering and performing treatments and interventions, ordering and review of laboratory studies, ordering and review of radiographic studies, pulse oximetry and re-evaluation of patient's condition. Labs Review Labs Reviewed   MRSA PCR SCREENING - Abnormal; Notable for the following:    MRSA by PCR POSITIVE (*)    All other components within normal limits  BASIC METABOLIC PANEL - Abnormal; Notable for the following:    GFR calc non Af Amer 62 (*)    GFR calc Af Amer 72 (*)    All other components within normal limits  BASIC METABOLIC PANEL - Abnormal; Notable for the following:    Glucose, Bld 133 (*)    GFR calc non Af Amer 67 (*)    GFR calc Af Amer 77 (*)    All other components within normal limits  CBC - Abnormal; Notable for the following:    RBC 4.08 (*)    HCT 38.5 (*)    Platelets 121 (*)    All other components within normal limits  CK TOTAL AND CKMB - Abnormal; Notable for the following:    Total CK 339 (*)    CK, MB 40.8 (*)  Relative Index 12.0 (*)    All other components within normal limits  TROPONIN I - Abnormal; Notable for the following:    Troponin I 18.01 (*)    All other components within normal limits  TROPONIN I - Abnormal; Notable for the following:    Troponin I >20.00 (*)    All other components within normal limits  TROPONIN I - Abnormal; Notable for the following:    Troponin I >20.00 (*)    All other components within normal limits  LIPID PANEL - Abnormal; Notable for the following:    HDL 27 (*)    All other components within normal limits  POCT I-STAT, CHEM 8 - Abnormal; Notable for the following:    Potassium 3.5 (*)    Glucose, Bld 100 (*)    Calcium, Ion 1.12 (*)    All other components within normal limits  CBC  PRO B NATRIURETIC PEPTIDE  APTT  PROTIME-INR  I-STAT TROPOININ, ED  POCT I-STAT TROPONIN I  POCT ACTIVATED CLOTTING TIME  POCT ACTIVATED CLOTTING TIME    Imaging Review No results found.   ECG: sinus rhythm, rate 62, inferior STEMI, entered in Muse, no prior ECG available  MDM   Final diagnoses:  ST elevation myocardial infarction (STEMI), unspecified artery    The patient appears reasonably stabilized for admission considering the  current resources, flow, and capabilities available in the ED at this time, and I doubt any other [facility] requiring further screening and/or treatment in the ED prior to admission.    Babette Relic, MD 04/12/14 [phone]

## 2014-04-08 NOTE — ED Notes (Signed)
EDP at the bedside.  ?

## 2014-04-08 NOTE — Progress Notes (Signed)
Chaplain Note: Chaplain responded to Code Stemi from ED. Patient was not available at this time. Patient was later transferred to the Cath Lab. Family is present in the waiting room, but does not need a Chaplain at this time.  Sheryn Bison, Chaplain

## 2014-04-08 NOTE — ED Notes (Signed)
Pt reports that this morning he was at the dentist and began to have chest pain after he left. States that the pain is in the center of his chest and his arms feel heavy. Denies any nausea but states that he feels SOB.

## 2014-04-08 NOTE — ED Notes (Signed)
Pt transported to Cath lab

## 2014-04-08 NOTE — ED Notes (Signed)
Stevie Kern, MD at bedside.

## 2014-04-08 NOTE — CV Procedure (Signed)
PROCEDURE:  Left heart catheterization with selective coronary angiography, left ventriculogram.  Aspiration thrombectomy of the RCA. PCI of the RCA.  INDICATIONS:  Acute inferior MI  The risks, benefits, and details of the procedure were explained to the patient.  The patient verbalized understanding and wanted to proceed.  Informed written consent was obtained.  PROCEDURE TECHNIQUE:  After Xylocaine anesthesia a 32F slender sheath was placed in the right radial artery with a single anterior needle wall stick. IV heparin was given.  Right coronary angiography was done using a Judkins R4 guide catheter.  Left coronary angiography was done using a Judkins L3.5 guide catheter. The intervention was performed. Please see below for details. Left ventriculography was done using a pigtail catheter.  A TR band was used for hemostasis.   CONTRAST:  Total of 110 cc.  COMPLICATIONS:  None.    HEMODYNAMICS:  Aortic pressure was 104/54; LV pressure was 103/1; LVEDP 10.  There was no gradient between the left ventricle and aorta.    ANGIOGRAPHIC DATA:   The left main coronary artery is patent with mild distal disease.  The left anterior descending artery is a large vessel which reaches the apex. There is mild atherosclerosis in the mid vessel. There are left to right collaterals feeding the PDA via septal perforator arteries. There is a medium-sized diagonal vessel which is patent.  The left circumflex artery is a large vessel. There is mild, diffuse atherosclerosis in the proximal to mid vessel. There is a large ramus vessel which is widely patent. The 3 significant obtuse marginal vessels have only mild disease.  The right coronary artery is occluded in the midportion. There is mild, atherosclerosis proximally. After intervention, it was noted that there was a mild, focal lesion in the distal RCA. The posterior lateral artery is large and widely patent. The posterior descending artery is widely  patent.  LEFT VENTRICULOGRAM:  Left ventricular angiogram was done in the 30 RAO projection and revealed normal left ventricular wall motion and systolic function with an estimated ejection fraction of 55%.  LVEDP was 10 mmHg.  PCI NARRATIVE: A JR 4 guiding catheter was used to engage the RCA. Additional IV heparin was given. CT was used to check that the heparin was therapeutic. IV tirofiban was given. A pro-water wire was placed across the area disease in the mid RCA. TIMI 2 flow was restored with the wire placement. Aspiration thrombectomy was performed using a priority 1 catheter. A 2.5 x 12 balloon was used to predilate the lesion.  A 3.0 x 18 vision bare-metal stent was deployed. The stent was post dilated with a 3.5 x 15 noncompliant balloon with several inflations to high pressure. There was an excellent angiographic result. There is no residual stenosis.  During the procedure, after reperfusion, the patient became bradycardic. He required one half amp of atropine due to hypotension and severe bradycardia.  His rhythm changed to accelerated idioventricular rhythm with reperfusion. By the end of the procedure, as rhythm had stabilized to normal sinus rhythm. He tolerated the procedure well and was pain-free upon leaving the cath.  IMPRESSIONS:  1. Patent left main coronary artery. 2. Mild disease in the left anterior descending artery and its branches. 3. Mild disease in the left circumflex artery and its branches. 4. Occluded mid right coronary artery.  This was the culprit for today's presentation. This was successfully treated with a 3.0 x 18 vision bare-metal stent, postdilated to 3.6 mm in diameter.  A bare-metal stent was chosen because the patient has several invasive procedures including esophageal stretching later this month. 5. Normal left ventricular systolic function.  LVEDP 10 mmHg.  Ejection fraction 55 %.  RECOMMENDATION:  Continue dual antiplatelet therapy for at least 30 days.  He'll need aggressive secondary prevention. Invasive dental and GI procedures will have to be postponed until at least mid-September. He'll be watched in the ICU. No complications, expected discharge on 8/6.

## 2014-04-08 NOTE — H&P (Signed)
Angel West is an 73 y.o. male.   Primary Cardiologist: unsure PMD: [name], MD  Chief Complaint: chest pain  HPI: 73 y/o with a h/o PTCA many years ago who had about 4-5 hours of gradual onset  Of mild aching across his chest.  It radiated towards both upper arms with some slight numbness to both upper arms.  He had some mild shortness of breath as well with a heavy feeling in both upper arms.  Sx started after a dental cleaning.    He needs a tooth extraction and esophageal dilatation, both scheduled for August.   Past Medical History  Diagnosis Date  . Hypercholesteremia   . Hypertension   . Coronary artery disease   . Cancer prostate  . Dysphagia   . Acute myocardial infarction of other inferior wall, initial episode of care     Past Surgical History  Procedure Laterality Date  . Coronary artery bypass graft    . Colonoscopy  2007  . Esophagogastroduodenoscopy  08/25/2011    Procedure: ESOPHAGOGASTRODUODENOSCOPY (EGD);  Surgeon: Garlan Fair, MD;  Location: [facility];  Service: Endoscopy;  Laterality: N/A;  . Balloon dilation  08/25/2011    Procedure: BALLOON DILATION;  Surgeon: Garlan Fair, MD;  Location: [facility];  Service: Endoscopy;  Laterality: N/A;    Family History  Problem Relation Age of Onset  . Anesthesia problems Neg Hx   . Malignant hyperthermia Neg Hx    Social History:  reports that he has quit smoking. He does not have any smokeless tobacco history on file. He reports that he does not drink alcohol or use illicit drugs.  Allergies: No Known Allergies  Medications Prior to Admission  Medication Sig Dispense Refill  . cholecalciferol (VITAMIN D) 1000 UNITS tablet Take 1,000 Units by mouth daily.        Marland Kitchen omeprazole (PRILOSEC) 20 MG capsule Take 1 capsule (20 mg total) by mouth daily.  30 capsule  12  . pravastatin (PRAVACHOL) 20 MG tablet Take 20 mg by mouth daily.        Marland Kitchen tiotropium (SPIRIVA) 18 MCG inhalation capsule  Place 18 mcg into inhaler and inhale daily.          Results for orders placed during the hospital encounter of 04/08/14 (from the past 48 hour(s))  CBC     Status: None   Collection Time    04/08/14  5:23 PM      Result Value Ref Range   WBC 9.6  4.0 - 10.5 K/uL   RBC 4.79  4.22 - 5.81 MIL/uL   Hemoglobin 15.6  13.0 - 17.0 g/dL   HCT 44.3  39.0 - 52.0 %   MCV 92.5  78.0 - 100.0 fL   MCH 32.6  26.0 - 34.0 pg   MCHC 35.2  30.0 - 36.0 g/dL   RDW 12.7  11.5 - 15.5 %   Platelets 158  150 - 400 K/uL  BASIC METABOLIC PANEL     Status: Abnormal   Collection Time    04/08/14  5:23 PM      Result Value Ref Range   Sodium 143  137 - 147 mEq/L   Potassium 4.0  3.7 - 5.3 mEq/L   Chloride 104  96 - 112 mEq/L   CO2 24  19 - 32 mEq/L   Glucose, Bld 95  70 - 99 mg/dL   BUN 14  6 - 23 mg/dL   Creatinine, Ser 1.12  0.50 -  1.35 mg/dL   Calcium 9.3  8.4 - 10.5 mg/dL   GFR calc non Af Amer 62 (*) >90 mL/min   GFR calc Af Amer 72 (*) >90 mL/min   Comment: (NOTE)     The eGFR has been calculated using the CKD EPI equation.     This calculation has not been validated in all clinical situations.     eGFR's persistently <90 mL/min signify possible Chronic Kidney     Disease.   Anion gap 15  5 - 15  PRO B NATRIURETIC PEPTIDE     Status: None   Collection Time    04/08/14  5:23 PM      Result Value Ref Range   Pro B Natriuretic peptide (BNP) 140.6  0 - 450 pg/mL  APTT     Status: None   Collection Time    04/08/14  5:23 PM      Result Value Ref Range   aPTT 30  24 - 37 seconds  PROTIME-INR     Status: None   Collection Time    04/08/14  5:23 PM      Result Value Ref Range   Prothrombin Time 13.1  11.6 - 15.2 seconds   INR 0.99  0.00 - 1.49  POCT I-STAT TROPONIN I     Status: None   Collection Time    04/08/14  5:58 PM      Result Value Ref Range   Troponin i, poc 0.08  0.00 - 0.08 ng/mL   Comment NOTIFIED PHYSICIAN     Comment 3            Comment: Due to the release kinetics of cTnI,      a negative result within the first hours     of the onset of symptoms does not rule out     myocardial infarction with certainty.     If myocardial infarction is still suspected,     repeat the test at appropriate intervals.  POCT I-STAT, CHEM 8     Status: Abnormal   Collection Time    04/08/14  6:07 PM      Result Value Ref Range   Sodium 142  137 - 147 mEq/L   Potassium 3.5 (*) 3.7 - 5.3 mEq/L   Chloride 110  96 - 112 mEq/L   BUN 13  6 - 23 mg/dL   Creatinine, Ser 1.20  0.50 - 1.35 mg/dL   Glucose, Bld 100 (*) 70 - 99 mg/dL   Calcium, Ion 1.12 (*) 1.13 - 1.30 mmol/L   TCO2 23  0 - 100 mmol/L   Hemoglobin 14.3  13.0 - 17.0 g/dL   HCT 42.0  39.0 - 52.0 %  POCT ACTIVATED CLOTTING TIME     Status: None   Collection Time    04/08/14  6:15 PM      Result Value Ref Range   Activated Clotting Time 281    POCT ACTIVATED CLOTTING TIME     Status: None   Collection Time    04/08/14  6:38 PM      Result Value Ref Range   Activated Clotting Time 259     No results found.  ROS: As above.  No bleeding problems.  No edema.  Does not take good care of his teeth in general.  No prior TIA or stroke. All others negative.  OBJECTIVE:   Vitals:   Filed Vitals:   04/08/14 1740 04/08/14 1741 04/08/14 1755 04/08/14 1919  BP:      Pulse:  70 70 73  Temp:  98.1 F (36.7 C)    TempSrc:  Oral    Resp:  26  18  Height: 6' (1.829 m)   _0  (1.803 m)  Weight: 609 lb (276.241 kg) 209 lb (94.802 kg)  208 lb 12.4 oz (94.7 kg)  SpO2:  100%  100%   I&O's:  No intake or output data in the 24 hours ending 04/08/14 1936 TELEMETRY: Reviewed telemetry pt in NSR:     PHYSICAL EXAM General: Well developed, well nourished, in no acute distress Head:   Normal cephalic and atramatic  Lungs:  No wheezing Heart:   HRRR S1 S2  No JVD.   Abdomen: abdomen soft and non-tender Msk:  Back normal,  Normal strength and tone for age. Extremities:  No edema.   2+ right radial pulse Neuro: Alert and  oriented. Psych:  Normal affect, responds appropriately  LABS: Basic Metabolic Panel:  Recent Labs  04/08/14 1723 04/08/14 1807  NA 143 142  K 4.0 3.5*  CL 104 110  CO2 24  --   GLUCOSE 95 100*  BUN 14 13  CREATININE 1.12 1.20  CALCIUM 9.3  --    Liver Function Tests: No results found for this basename: AST, ALT, ALKPHOS, BILITOT, PROT, ALBUMIN,  in the last 72 hours No results found for this basename: LIPASE, AMYLASE,  in the last 72 hours CBC:  Recent Labs  04/08/14 1723 04/08/14 1807  WBC 9.6  --   HGB 15.6 14.3  HCT 44.3 42.0  MCV 92.5  --   PLT 158  --    Cardiac Enzymes: No results found for this basename: CKTOTAL, CKMB, CKMBINDEX, TROPONINI,  in the last 72 hours BNP: No components found with this basename: POCBNP,  D-Dimer: No results found for this basename: DDIMER,  in the last 72 hours Hemoglobin A1C: No results found for this basename: HGBA1C,  in the last 72 hours Fasting Lipid Panel: No results found for this basename: CHOL, HDL, LDLCALC, TRIG, CHOLHDL, LDLDIRECT,  in the last 72 hours Thyroid Function Tests: No results found for this basename: TSH, T4TOTAL, FREET3, T3FREE, THYROIDAB,  in the last 72 hours Anemia Panel: No results found for this basename: VITAMINB12, FOLATE, FERRITIN, TIBC, IRON, RETICCTPCT,  in the last 72 hours Coag Panel:   Lab Results  Component Value Date   INR 0.99 04/08/2014       Assessment/Plan 1) Acute inferior MI.  I suspect he has collaterals because the ECG is not severely abnormal.  He will be broought to the cath lab emergently for angiography and possible PCI.  He is agreeable.  Plan Bare metal stent if possible.  Further plans based on cath result.  BP and lipid control needed as well.   VARANASI,JAYADEEP S. 04/08/2014, 7:36 PM

## 2014-04-09 DIAGNOSIS — I4729 Other ventricular tachycardia: Secondary | ICD-10-CM | POA: Diagnosis present

## 2014-04-09 DIAGNOSIS — I472 Ventricular tachycardia: Secondary | ICD-10-CM

## 2014-04-09 LAB — LIPID PANEL
CHOLESTEROL: 130 mg/dL (ref 0–200)
HDL: 27 mg/dL — AB (ref >39)
LDL Cholesterol: 81 mg/dL (ref 0–99)
Total CHOL/HDL Ratio: 4.8 RATIO
Triglycerides: 112 mg/dL (ref <150)
VLDL: 22 mg/dL (ref 0–40)

## 2014-04-09 LAB — BASIC METABOLIC PANEL
Anion gap: 11 (ref 5–15)
BUN: 13 mg/dL (ref 6–23)
CHLORIDE: 107 meq/L (ref 96–112)
CO2: 25 meq/L (ref 19–32)
CREATININE: 1.06 mg/dL (ref 0.50–1.35)
Calcium: 8.4 mg/dL (ref 8.4–10.5)
GFR calc Af Amer: 77 mL/min — ABNORMAL LOW (ref >90)
GFR calc non Af Amer: 67 mL/min — ABNORMAL LOW (ref >90)
GLUCOSE: 133 mg/dL — AB (ref 70–99)
POTASSIUM: 4.2 meq/L (ref 3.7–5.3)
Sodium: 143 mEq/L (ref 137–147)

## 2014-04-09 LAB — CBC
HEMATOCRIT: 38.5 % — AB (ref 39.0–52.0)
Hemoglobin: 13.2 g/dL (ref 13.0–17.0)
MCH: 32.4 pg (ref 26.0–34.0)
MCHC: 34.3 g/dL (ref 30.0–36.0)
MCV: 94.4 fL (ref 78.0–100.0)
Platelets: 121 10*3/uL — ABNORMAL LOW (ref 150–400)
RBC: 4.08 MIL/uL — AB (ref 4.22–5.81)
RDW: 12.8 % (ref 11.5–15.5)
WBC: 5.7 10*3/uL (ref 4.0–10.5)

## 2014-04-09 LAB — TROPONIN I
Troponin I: >20 ng/mL (ref <0.30)
Troponin I: >20 ng/mL (ref <0.30)

## 2014-04-09 MED ORDER — CARVEDILOL 3.125 MG PO TABS
3.1250 mg | ORAL_TABLET | Freq: Two times a day (BID) | ORAL | Status: DC
  Administered 2014-04-09 – 2014-04-10 (×2): 3.125 mg via ORAL
  Filled 2014-04-09 (×4): qty 1

## 2014-04-09 NOTE — Care Management Note (Addendum)
    Page 1 of 1   04/10/2014     3:18:15 PM CARE MANAGEMENT NOTE 04/10/2014  Patient:  Angel West, Angel West   Account Number:  [id number]  Date Initiated:  04/09/2014  Documentation initiated by:  GRAVES-BIGELOW,BRENDA  Subjective/Objective Assessment:   Pt admitted for cp-Acute inferior MI. Initiated on Brilinta. Pt uses [facility]. [place]- medication is available.     Action/Plan:   CM did  provide pt with 30 day free brilinta card. Pt will need Rx for 30 day free. Benefits check in process and will make pt aware once completed.   Anticipated DC Date:  04/10/2014   Anticipated DC Plan:  [place]  CM consult      Choice offered to / List presented to:             Status of service:  Completed, signed off Medicare Important Message given?  NA - LOS <3 / Initial given by admissions (If response is "NO", the following Medicare IM given date fields will be blank) Date Medicare IM given:   Medicare IM given by:   Date Additional Medicare IM given:   Additional Medicare IM given by:    Discharge Disposition:  HOME/SELF CARE  Per UR Regulation:  Reviewed for med. necessity/level of care/duration of stay  If discussed at Long Length of Stay Meetings, dates discussed:    Comments:  PT COPAY WILL BE $45- [place] did make pt aware.

## 2014-04-09 NOTE — Progress Notes (Signed)
CARDIAC REHAB PHASE I   PRE:  Rate/Rhythm: 55 SB  BP:  Supine: 140/70  Sitting:   Standing:    SaO2: 98 RA  MODE:  Ambulation: 700 ft   POST:  Rate/Rhythm: 72 SR  BP:  Supine:   Sitting: 141/60  Standing:    SaO2:  1430-1440 Pt tolerated ambulation well without c/o of cp or SOB. VS stable. Pt to recliner after walk with call light in reach. Started MI and stent education with pt and wife. They voice understanding. We discussed smoking cessation. I gave pt tips for quitting, quit smart class information and coaching contact number. He states that he quit the day he had hs heart attack. Pt did quit for 10 years but had restarted. He plans to quit "cold [place]", which worked well for him before. We discussed relapse and ways to avoid that. Pt seems very motivated to making changes. We discussed Outpt. CRP and he agrees to referral to [facility] program. We will follow pt tomorrow.  Rodney Langton RN 04/09/2014 2:36 PM

## 2014-04-09 NOTE — Progress Notes (Signed)
DAILY PROGRESS NOTE  Subjective:  No events noted overnight. Troponin elevated to >20. Cholesterol is noted to be quite low, total 130, LDL 81. He was on pravachol but now on high dose lipitor for acute plaque stabilization.   Objective:  Temp:  [97.8 F (36.6 C)-98.5 F (36.9 C)] 98.5 F (36.9 C) (08/05 0700) Pulse Rate:  [47-76] 76 (08/05 1000) Resp:  [12-26] 15 (08/05 1000) BP: (102-181)/(54-98) 144/85 mmHg (08/05 1000) SpO2:  [95 %-100 %] 98 % (08/05 1000) Weight:  [208 lb 12.4 oz (94.7 kg)-609 lb (276.241 kg)] 208 lb 12.4 oz (94.7 kg) (08/04 1919) Weight change:   Intake/Output from previous day: 08/04 0701 - 08/05 0700 In: 840.6 [I.V.:840.6] Out: -   Intake/Output from this shift:    Medications: Current Facility-Administered Medications  Medication Dose Route Frequency Provider Last Rate Last Dose  . acetaminophen (TYLENOL) tablet 650 mg  650 mg Oral Q4H PRN Almyra Deforest, PA      . aspirin EC tablet 81 mg  81 mg Oral Daily [place], [place]   81 mg at 04/09/14 0949  . atorvastatin (LIPITOR) tablet 80 mg  80 mg Oral q1800 Almyra Deforest, [place]      . Chlorhexidine Gluconate Cloth 2 % PADS 6 each  6 each Topical Q0600 Jettie Booze, MD   6 each at 04/09/14 [phone]  . cholecalciferol (VITAMIN D) tablet 1,000 Units  1,000 Units Oral Daily Almyra Deforest, [place]   1,000 Units at 04/09/14 [phone]  . metoprolol tartrate (LOPRESSOR) tablet 12.5 mg  12.5 mg Oral BID Jettie Booze, MD   12.5 mg at 04/09/14 0949  . mupirocin ointment (BACTROBAN) 2 % 1 application  1 application Nasal BID Jettie Booze, MD   1 application at 85/88/50 [phone]  . nitroGLYCERIN (NITROSTAT) SL tablet 0.4 mg  0.4 mg Sublingual Q5 Min x 3 PRN Almyra Deforest, PA      . ondansetron (ZOFRAN) injection 4 mg  4 mg Intravenous Q6H PRN Almyra Deforest, PA      . pantoprazole (PROTONIX) EC tablet 40 mg  40 mg Oral Daily Almyra Deforest, [place]   40 mg at 04/09/14 0950  . ticagrelor (BRILINTA) tablet 90 mg  90 mg Oral BID Jettie Booze, MD   90  mg at 04/09/14 0950  . tiotropium (SPIRIVA) inhalation capsule 18 mcg  18 mcg Inhalation Daily Almyra Deforest, [place]   18 mcg at 04/09/14 1287    Physical Exam: General appearance: alert and no distress Neck: no carotid bruit and no JVD Lungs: clear to auscultation bilaterally Heart: regular rate and rhythm, S1, S2 normal, no murmur, click, rub or gallop Extremities: mild ecchymosis at the right cath site Pulses: 2+ and symmetric  Lab Results: Results for orders placed during the hospital encounter of 04/08/14 (from the past 48 hour(s))  CBC     Status: None   Collection Time    04/08/14  5:23 PM      Result Value Ref Range   WBC 9.6  4.0 - 10.5 K/uL   RBC 4.79  4.22 - 5.81 MIL/uL   Hemoglobin 15.6  13.0 - 17.0 g/dL   HCT 44.3  39.0 - 52.0 %   MCV 92.5  78.0 - 100.0 fL   MCH 32.6  26.0 - 34.0 pg   MCHC 35.2  30.0 - 36.0 g/dL   RDW 12.7  11.5 - 15.5 %   Platelets 158  150 - 400 K/uL  BASIC METABOLIC  PANEL     Status: Abnormal   Collection Time    04/08/14  5:23 PM      Result Value Ref Range   Sodium 143  137 - 147 mEq/L   Potassium 4.0  3.7 - 5.3 mEq/L   Chloride 104  96 - 112 mEq/L   CO2 24  19 - 32 mEq/L   Glucose, Bld 95  70 - 99 mg/dL   BUN 14  6 - 23 mg/dL   Creatinine, Ser 1.12  0.50 - 1.35 mg/dL   Calcium 9.3  8.4 - 10.5 mg/dL   GFR calc non Af Amer 62 (*) >90 mL/min   GFR calc Af Amer 72 (*) >90 mL/min   Comment: (NOTE)     The eGFR has been calculated using the CKD EPI equation.     This calculation has not been validated in all clinical situations.     eGFR's persistently <90 mL/min signify possible Chronic Kidney     Disease.   Anion gap 15  5 - 15  PRO B NATRIURETIC PEPTIDE     Status: None   Collection Time    04/08/14  5:23 PM      Result Value Ref Range   Pro B Natriuretic peptide (BNP) 140.6  0 - 450 pg/mL  APTT     Status: None   Collection Time    04/08/14  5:23 PM      Result Value Ref Range   aPTT 30  24 - 37 seconds  PROTIME-INR     Status: None    Collection Time    04/08/14  5:23 PM      Result Value Ref Range   Prothrombin Time 13.1  11.6 - 15.2 seconds   INR 0.99  0.00 - 1.49  POCT I-STAT TROPONIN I     Status: None   Collection Time    04/08/14  5:58 PM      Result Value Ref Range   Troponin i, poc 0.08  0.00 - 0.08 ng/mL   Comment NOTIFIED PHYSICIAN     Comment 3            Comment: Due to the release kinetics of cTnI,     a negative result within the first hours     of the onset of symptoms does not rule out     myocardial infarction with certainty.     If myocardial infarction is still suspected,     repeat the test at appropriate intervals.  POCT I-STAT, CHEM 8     Status: Abnormal   Collection Time    04/08/14  6:07 PM      Result Value Ref Range   Sodium 142  137 - 147 mEq/L   Potassium 3.5 (*) 3.7 - 5.3 mEq/L   Chloride 110  96 - 112 mEq/L   BUN 13  6 - 23 mg/dL   Creatinine, Ser 1.20  0.50 - 1.35 mg/dL   Glucose, Bld 100 (*) 70 - 99 mg/dL   Calcium, Ion 1.12 (*) 1.13 - 1.30 mmol/L   TCO2 23  0 - 100 mmol/L   Hemoglobin 14.3  13.0 - 17.0 g/dL   HCT 42.0  39.0 - 52.0 %  POCT ACTIVATED CLOTTING TIME     Status: None   Collection Time    04/08/14  6:15 PM      Result Value Ref Range   Activated Clotting Time 281    POCT ACTIVATED CLOTTING TIME  Status: None   Collection Time    04/08/14  6:38 PM      Result Value Ref Range   Activated Clotting Time 259    CK TOTAL AND CKMB     Status: Abnormal   Collection Time    04/08/14  7:18 PM      Result Value Ref Range   Total CK 339 (*) 7 - 232 U/L   CK, MB 40.8 (*) 0.3 - 4.0 ng/mL   Comment: CRITICAL RESULT CALLED TO, READ BACK BY AND VERIFIED WITH:     KOUFFER M,RN 04/08/14 2131 WAYK   Relative Index 12.0 (*) 0.0 - 2.5  TROPONIN I     Status: Abnormal   Collection Time    04/08/14  7:18 PM      Result Value Ref Range   Troponin I 18.01 (*) <0.30 ng/mL   Comment:            Due to the release kinetics of cTnI,     a negative result within the first  hours     of the onset of symptoms does not rule out     myocardial infarction with certainty.     If myocardial infarction is still suspected,     repeat the test at appropriate intervals.     CRITICAL RESULT CALLED TO, READ BACK BY AND VERIFIED WITH:     KOUFFER M,RN 04/08/14 2131 WAYK  MRSA PCR SCREENING     Status: Abnormal   Collection Time    04/08/14  7:19 PM      Result Value Ref Range   MRSA by PCR POSITIVE (*) NEGATIVE   Comment:            The GeneXpert MRSA Assay (FDA     approved for NASAL specimens     only), is one component of a     comprehensive MRSA colonization     surveillance program. It is not     intended to diagnose MRSA     infection nor to guide or     monitor treatment for     MRSA infections.     RESULT CALLED TO, READ BACK BY AND VERIFIED WITH:     Jerilynn Mages [id number] RN 2118 04/08/14 A BROWNING  TROPONIN I     Status: Abnormal   Collection Time    04/09/14 12:12 AM      Result Value Ref Range   Troponin I >20.00 (*) <0.30 ng/mL   Comment:            Due to the release kinetics of cTnI,     a negative result within the first hours     of the onset of symptoms does not rule out     myocardial infarction with certainty.     If myocardial infarction is still suspected,     repeat the test at appropriate intervals.     CRITICAL VALUE NOTED.  VALUE IS CONSISTENT WITH PREVIOUSLY REPORTED AND CALLED VALUE.  BASIC METABOLIC PANEL     Status: Abnormal   Collection Time    04/09/14  1:12 AM      Result Value Ref Range   Sodium 143  137 - 147 mEq/L   Potassium 4.2  3.7 - 5.3 mEq/L   Chloride 107  96 - 112 mEq/L   CO2 25  19 - 32 mEq/L   Glucose, Bld 133 (*) 70 - 99 mg/dL   BUN 13  6 -  23 mg/dL   Creatinine, Ser 1.06  0.50 - 1.35 mg/dL   Calcium 8.4  8.4 - 10.5 mg/dL   GFR calc non Af Amer 67 (*) >90 mL/min   GFR calc Af Amer 77 (*) >90 mL/min   Comment: (NOTE)     The eGFR has been calculated using the CKD EPI equation.     This calculation has not been  validated in all clinical situations.     eGFR's persistently <90 mL/min signify possible Chronic Kidney     Disease.   Anion gap 11  5 - 15  CBC     Status: Abnormal   Collection Time    04/09/14  1:12 AM      Result Value Ref Range   WBC 5.7  4.0 - 10.5 K/uL   RBC 4.08 (*) 4.22 - 5.81 MIL/uL   Hemoglobin 13.2  13.0 - 17.0 g/dL   HCT 38.5 (*) 39.0 - 52.0 %   MCV 94.4  78.0 - 100.0 fL   MCH 32.4  26.0 - 34.0 pg   MCHC 34.3  30.0 - 36.0 g/dL   RDW 12.8  11.5 - 15.5 %   Platelets 121 (*) 150 - 400 K/uL   Comment: DELTA CHECK NOTED     REPEATED TO VERIFY     SPECIMEN CHECKED FOR CLOTS  LIPID PANEL     Status: Abnormal   Collection Time    04/09/14  1:12 AM      Result Value Ref Range   Cholesterol 130  0 - 200 mg/dL   Triglycerides 112  <150 mg/dL   HDL 27 (*) >39 mg/dL   Total CHOL/HDL Ratio 4.8     VLDL 22  0 - 40 mg/dL   LDL Cholesterol 81  0 - 99 mg/dL   Comment:            Total Cholesterol/HDL:CHD Risk     Coronary Heart Disease Risk Table                         Men   Women      1/2 Average Risk   3.4   3.3      Average Risk       5.0   4.4      2 X Average Risk   9.6   7.1      3 X Average Risk  23.4   11.0                Use the calculated Patient Ratio     above and the CHD Risk Table     to determine the patient's CHD Risk.                ATP III CLASSIFICATION (LDL):      <100     mg/dL   Optimal      100-129  mg/dL   Near or Above                        Optimal      130-159  mg/dL   Borderline      160-189  mg/dL   High      >190     mg/dL   Very High  TROPONIN I     Status: Abnormal   Collection Time    04/09/14  7:18 AM      Result Value Ref Range  Troponin I >20.00 (*) <0.30 ng/mL   Comment:            Due to the release kinetics of cTnI,     a negative result within the first hours     of the onset of symptoms does not rule out     myocardial infarction with certainty.     If myocardial infarction is still suspected,     repeat the test at  appropriate intervals.     CRITICAL VALUE NOTED.  VALUE IS CONSISTENT WITH PREVIOUSLY REPORTED AND CALLED VALUE.    Imaging: Dg Chest Port 1 View  04/08/2014   CLINICAL DATA:  Shortness of breath.  Chest pain.  EXAM: PORTABLE CHEST - 1 VIEW  COMPARISON:  Chest radiograph July 08, 2011  FINDINGS: Cardiomediastinal silhouette is normal, mildly tortuous aorta. Mild chronic interstitial changes. The lungs are otherwise clear without pleural effusions or focal consolidations. Trachea projects midline and there is no pneumothorax. Soft tissue planes and included osseous structures are non-suspicious.  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   Electronically Signed   By: Elon Alas   On: 04/08/2014 20:13    Assessment:  1. Active Problems: 2.   Acute myocardial infarction of other inferior wall, initial episode of care 3.   ST elevation myocardial infarction (STEMI) of inferior wall, initial episode of care 4.   Hypertension 5.   Hypercholesteremia 6.   Plan:  1. Angel West is feeling better today. HR is slow, however, he has a history of bradycardia and is on low dose b-blocker - will switch to coreg 3.125 BID. Still having short runs of NSVT. Blood pressure has recovered. Ambulate with cardiac rehab today. Ok to transfer to telemetry bed this afternoon.  Time Spent Directly with Patient:  15 minutes  Length of Stay:  LOS: 1 day   Pixie Casino, MD, [facility] Attending Cardiologist [facility]  HILTY,Kenneth C 04/09/2014, 10:48 AM

## 2014-04-10 ENCOUNTER — Encounter (HOSPITAL_COMMUNITY): Payer: Self-pay | Admitting: Cardiology

## 2014-04-10 DIAGNOSIS — Z95820 Peripheral vascular angioplasty status with implants and grafts: Secondary | ICD-10-CM

## 2014-04-10 DIAGNOSIS — Z720 Tobacco use: Secondary | ICD-10-CM | POA: Diagnosis present

## 2014-04-10 HISTORY — DX: Tobacco use: Z72.0

## 2014-04-10 MED ORDER — ACETAMINOPHEN 325 MG PO TABS: 650.0000 mg | ORAL_TABLET | ORAL | Status: DC | PRN

## 2014-04-10 MED ORDER — NITROGLYCERIN 0.4 MG SL SUBL: 0.4000 mg | SUBLINGUAL_TABLET | SUBLINGUAL | Status: DC | PRN

## 2014-04-10 MED ORDER — PANTOPRAZOLE SODIUM 40 MG PO TBEC: 40.0000 mg | DELAYED_RELEASE_TABLET | Freq: Every day | ORAL | Status: DC

## 2014-04-10 MED ORDER — ATORVASTATIN CALCIUM 80 MG PO TABS: 80.0000 mg | ORAL_TABLET | Freq: Every day | ORAL | Status: DC

## 2014-04-10 MED ORDER — TIOTROPIUM BROMIDE MONOHYDRATE 18 MCG IN CAPS: 18.0000 ug | ORAL_CAPSULE | Freq: Every day | RESPIRATORY_TRACT | Status: DC

## 2014-04-10 MED ORDER — TICAGRELOR 90 MG PO TABS
90.0000 mg | ORAL_TABLET | Freq: Two times a day (BID) | ORAL | Status: DC
Start: 2014-04-10 — End: 2014-04-10

## 2014-04-10 MED ORDER — ASPIRIN 81 MG PO TBEC: 81.0000 mg | DELAYED_RELEASE_TABLET | Freq: Every day | ORAL | Status: DC

## 2014-04-10 MED ORDER — TICAGRELOR 90 MG PO TABS
90.0000 mg | ORAL_TABLET | Freq: Two times a day (BID) | ORAL | Status: DC
Start: 2014-04-10 — End: 2014-05-29

## 2014-04-10 MED ORDER — TICAGRELOR 90 MG PO TABS: 90.0000 mg | ORAL_TABLET | Freq: Two times a day (BID) | ORAL | Status: DC

## 2014-04-10 MED ORDER — CARVEDILOL 3.125 MG PO TABS: 3.1250 mg | ORAL_TABLET | Freq: Two times a day (BID) | ORAL | Status: DC

## 2014-04-10 NOTE — Discharge Summary (Signed)
Physician Discharge Summary       Patient ID: Angel West MRN: [id number] DOB/AGE: 09/07/1940 73 y.o.  Admit date: 04/08/2014 Discharge date: 04/10/2014  Cardiologist:  Dr. Irish Lack  Discharge Diagnoses:  Principal Problem:   ST elevation myocardial infarction (STEMI) of inferior wall, initial episode of care Active Problems:   S/P angioplasty with stent mRCA with BMS- vision   Hypertension   Hypercholesteremia   NSVT (nonsustained ventricular tachycardia)   Tobacco use   CAD in native artery, mild non obstructive residual diseas   Discharged Condition: good  Procedures: emergent cardiac cath by Dr. Irish Lack 04/08/14 PCI with BMS to RCA 04/08/14 by Dr. Irish Lack.  Hospital Course: 73 y/o with a h/o PTCA many years ago who had about 4-5 hours of gradual onset Of mild aching across his chest 04/08/14. It radiated towards both upper arms with some slight numbness to both upper arms. He had some mild shortness of breath as well with a heavy feeling in both upper arms. Sx started after a dental cleaning.   He needs a tooth extraction and esophageal dilatation, both scheduled for August should be postponed until Sept.  EKG with ST elevation in Inf. Leads- pt taken to cath lab.   CATH: 1. Patent left main coronary artery. 2. Mild disease in the left anterior descending artery and its branches. 3. Mild disease in the left circumflex artery and its branches. 4. Occluded mid right coronary artery. This was the culprit for today's presentation. This was successfully treated with a 3.0 x 18 vision bare-metal stent, postdilated to 3.6 mm in diameter. A bare-metal stent was chosen because the patient has several invasive procedures including esophageal stretching later this month. 5. Normal left ventricular systolic function. LVEDP 10 mmHg. Ejection fraction 55 %.  Pt has progressed without complications, no chest pain no SOB, we did change his statin and added coreg. Pk Troponin > 20.  He  ambulated with cardiac rehab without complications.  No procedures for 30 days and only after visit.     Consults: None  Significant Diagnostic Studies:  BMET    Component Value Date/Time   NA 143 04/09/2014 0112   K 4.2 04/09/2014 0112   CL 107 04/09/2014 0112   CO2 25 04/09/2014 0112   GLUCOSE 133* 04/09/2014 0112   BUN 13 04/09/2014 0112   CREATININE 1.06 04/09/2014 0112   CALCIUM 8.4 04/09/2014 0112   GFRNONAA 67* 04/09/2014 0112   GFRAA 77* 04/09/2014 0112    CBC    Component Value Date/Time   WBC 5.7 04/09/2014 0112   RBC 4.08* 04/09/2014 0112   HGB 13.2 04/09/2014 0112   HCT 38.5* 04/09/2014 0112   PLT 121* 04/09/2014 0112   MCV 94.4 04/09/2014 0112   MCH 32.4 04/09/2014 0112   MCHC 34.3 04/09/2014 0112   RDW 12.8 04/09/2014 0112     Troponin > 20 CK MB  40  CK 339  Lipid Panel     Component Value Date/Time   CHOL 130 04/09/2014 0112   TRIG 112 04/09/2014 0112   HDL 27* 04/09/2014 0112   CHOLHDL 4.8 04/09/2014 0112   VLDL 22 04/09/2014 0112   LDLCALC 81 04/09/2014 0112   .PORTABLE CHEST - 1 VIEW  COMPARISON: Chest radiograph July 08, 2011  FINDINGS:  Cardiomediastinal silhouette is normal, mildly tortuous aorta. Mild  chronic interstitial changes. The lungs are otherwise clear without  pleural effusions or focal consolidations. Trachea projects midline  and there is no pneumothorax. Soft tissue  planes and included  osseous structures are non-suspicious.  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    EKG: Normal sinus rhythm Left axis deviation Abnormal ECG   Discharge Exam: Blood pressure 120/70, pulse 62, temperature 98.2 F (36.8 C), temperature source Oral, resp. rate 18, height 5\' 11"  (1.803 m), weight 208 lb 4.8 oz (94.484 kg), SpO2 97.00%.    Disposition: 01-Home or Self Care      Discharge Instructions   Amb Referral to Cardiac Rehabilitation    Complete by:  As directed             Medication List    STOP taking these medications         omeprazole 20 MG capsule  Commonly known as:  PRILOSEC  Replaced by:  pantoprazole 40 MG tablet     pravastatin 20 MG tablet  Commonly known as:  PRAVACHOL      TAKE these medications       acetaminophen 325 MG tablet  Commonly known as:  TYLENOL  Take 2 tablets (650 mg total) by mouth every 4 (four) hours as needed for headache or mild pain.     aspirin 81 MG EC tablet  Take 1 tablet (81 mg total) by mouth daily.     atorvastatin 80 MG tablet  Commonly known as:  LIPITOR  Take 1 tablet (80 mg total) by mouth daily at 6 PM.     BIOTIN PO  Take 1 tablet by mouth daily.     CALCIUM PO  Take 1 tablet by mouth daily.     carvedilol 3.125 MG tablet  Commonly known as:  COREG  Take 1 tablet (3.125 mg total) by mouth 2 (two) times daily with a meal.     cholecalciferol 1000 UNITS tablet  Commonly known as:  VITAMIN D  Take 1,000 Units by mouth daily.     MAGNESIUM PO  Take 1 tablet by mouth daily with supper.     nitroGLYCERIN 0.4 MG SL tablet  Commonly known as:  NITROSTAT  Place 1 tablet (0.4 mg total) under the tongue every 5 (five) minutes x 3 doses as needed for chest pain.     NON FORMULARY  Take 1 tablet by mouth daily with supper. Formula one     pantoprazole 40 MG tablet  Commonly known as:  PROTONIX  Take 1 tablet (40 mg total) by mouth daily.     ticagrelor 90 MG Tabs tablet  Commonly known as:  BRILINTA  Take 1 tablet (90 mg total) by mouth 2 (two) times daily.     tiotropium 18 MCG inhalation capsule  Commonly known as:  SPIRIVA  Place 1 capsule (18 mcg total) into inhaler and inhale daily.       Follow-up Information   Follow up with Jettie Booze., MD On 04/30/2014. (at 0900 with his PA Kerin Ransom.)    Specialty:  Interventional Cardiology   Contact information:   [address] [address] [phone]        Discharge Instructions: Call [address] [phone] if any  bleeding, swelling or drainage at cath site.  May shower, no tub baths for 48 hours for groin sticks. No lifting over 5 pounds for 2 weeks, no driving for 1 week.  Heart Healthy diet.  Do not stop ASA and Brilinta   Take 1 NTG, under your tongue, while sitting.  If no relief of pain may repeat NTG,  one tab every 5 minutes up to 3 tablets total over 15 minutes.  If no relief CALL 911.  If you have dizziness/lightheadness  while taking NTG, stop taking and call 911.         Signed: Isaiah Serge Nurse Practitioner-Certified [facility]: [facility] 04/10/2014, 12:35 PM  Time spent on discharge :> 30 minutes.

## 2014-04-10 NOTE — Progress Notes (Signed)
Reviewed discharge instructions with patient and wife, they stated their understanding.  Discharged home with wife via wheelchair.  Sanda Linger

## 2014-04-10 NOTE — Progress Notes (Signed)
CARDIAC REHAB PHASE I   PRE:  Rate/Rhythm: 34 SR  BP:  Supine:   Sitting: 135/75  Standing:    SaO2:   MODE:  Ambulation: 650 ft   POST:  Rate/Rhythm: 60  BP:  Supine:   Sitting: 152/82  Standing:    SaO2:  1045-1126 Pt walked 650 ft with steady gait. Tolerated well. Completed ed re NTG use and exercise. Re enforced importance of brilinta. Encouraged smoking cessation. Understanding voiced by pt and wife.   Graylon Good, RN BSN  04/10/2014 11:24 AM

## 2014-04-10 NOTE — Discharge Instructions (Signed)
[address] [phone] if any bleeding, swelling or drainage at cath site.  May shower, no tub baths for 48 hours for groin sticks. No lifting over 5 pounds for 2 weeks, no driving for 1 week.  Heart Healthy diet.  Do not stop ASA and Brilinta   Take 1 NTG, under your tongue, while sitting.  If no relief of pain may repeat NTG, one tab every 5 minutes up to 3 tablets total over 15 minutes.  If no relief CALL 911.  If you have dizziness/lightheadness  while taking NTG, stop taking and call 911.           Acute Coronary Syndrome Acute coronary syndrome (ACS) is an urgent problem in which the blood and oxygen supply to the heart is critically deficient. ACS requires hospitalization because one or more coronary arteries may be blocked. ACS represents a range of conditions including:  Previous angina that is now unstable, lasts longer, happens at rest, or is more intense.  A heart attack, with heart muscle cell injury and death. There are three vital coronary arteries that supply the heart muscle with blood and oxygen so that it can pump blood effectively. If blockages to these arteries develop, blood flow to the heart muscle is reduced. If the heart does not get enough blood, angina may occur as the first warning sign. SYMPTOMS   The most common signs of angina include:  Tightness or squeezing in the chest.  Feeling of heaviness on the chest.  Discomfort in the arms, neck, back, or jaw.  Shortness of breath and nausea.  Cold, wet skin.  Angina is usually brought on by physical effort or excitement which increase the oxygen needs of the heart. These states increase the blood flow needs of the heart beyond what can be delivered.  Other symptoms that are not as common include:  Fatigue  Unexplained feelings of nervousness or anxiety  Weakness  Diarrhea  Sometimes, you may not have noticed any symptoms at all but still suffered a cardiac  injury. TREATMENT   Medicines to help discomfort may include nitroglycerin (nitro) in the form of tablets or a spray for rapid relief, or longer-acting forms such as cream, patches, or capsules. (Be aware that there are many side effects and possible interactions with other drugs).  Other medicines may be used to help the heart pump better.  Procedures to open blocked arteries including angioplasty or stent placement to keep the arteries open.  Open heart surgery may be needed when there are many blockages or they are in critical locations that are best treated with surgery. HOME CARE INSTRUCTIONS   Do not use any tobacco products including cigarettes, chewing tobacco, or electronic cigarettes.  Take one baby or adult aspirin daily, if your health care provider advises. This helps reduce the risk of a heart attack.  It is very important that you follow the angina treatment prescribed by your health care provider. Make arrangements for proper follow-up care.  Eat a heart healthy diet with salt and fat restrictions as advised.  Regular exercise is good for you as long as it does not cause discomfort. Do not begin any new type of exercise until you check with your health care provider.  If you are overweight, you should lose weight.  Try to maintain normal blood lipid levels.  Keep your blood pressure under control as recommended by your health care provider.  You should tell your health care provider right away about  any increase in the severity or frequency of your chest discomfort or angina attacks. When you have angina, you should stop what you are doing and sit down. This may bring relief in 3 to 5 minutes. If your health care provider has prescribed nitro, take it as directed.  If your health care provider has given you a follow-up appointment, it is very important to keep that appointment. Not keeping the appointment could result in a chronic or permanent injury, pain, and  disability. If there is any problem keeping the appointment, you must call back to this facility for assistance. SEEK IMMEDIATE MEDICAL CARE IF:   You develop nausea, vomiting, or shortness of breath.  You feel faint, lightheaded, or pass out.  Your chest discomfort gets worse.  You are sweating or experience sudden profound fatigue.  You do not get relief of your chest pain after 3 doses of nitro.  Your discomfort lasts longer than 15 minutes. MAKE SURE YOU:   Understand these instructions.  Will watch your condition.  Will get help right away if you are not doing well or get worse.  Take all medicines as directed by your health care provider. Document Released: 08/22/2005 Document Revised: 08/27/2013 Document Reviewed: 12/24/2013 [facility] Patient Information 2015 [place], [place]. This information is not intended to replace advice given to you by your health care provider. Make sure you discuss any questions you have with your health care provider.

## 2014-04-10 NOTE — Progress Notes (Signed)
Subjective: No chest pain no SOB  Objective: Vital signs in last 24 hours: Temp:  [98.2 F (36.8 C)-98.9 F (37.2 C)] 98.2 F (36.8 C) (08/06 0611) Pulse Rate:  [51-62] 62 (08/06 0611) Resp:  [14-18] 18 (08/06 0611) BP: (116-123)/(61-72) 120/70 mmHg (08/06 0611) SpO2:  [96 %-97 %] 97 % (08/06 0611) Weight:  [208 lb 4.8 oz (94.484 kg)] 208 lb 4.8 oz (94.484 kg) (08/06 0611) Weight change: -11.2 oz (-0.318 kg)   Intake/Output from previous day: +450 08/05 0701 - 08/06 0700 In: 600 [P.O.:600] Out: 150 [Urine:150] Intake/Output this shift:    PE: General:Pleasant affect, NAD Skin:Warm and dry, brisk capillary refill HEENT:normocephalic, sclera clear, mucus membranes moist Heart:S1S2 RRR without murmur, gallup, rub or click Lungs:clear, diminished without rales, rhonchi, or wheezes YYT:KPTW, non tender, + BS, do not palpate liver spleen or masses Ext:no lower ext edema, 2+ pedal pulses, 2+ radial pulses, rt radial cath site with bruising. Neuro:alert and oriented, MAE, follows commands, + facial symmetry   EKG:  SR with T wave inversion II,III, AVF, no change from previous. Lab Results:  Recent Labs  04/08/14 1723 04/08/14 1807 04/09/14 0112  WBC 9.6  --  5.7  HGB 15.6 14.3 13.2  HCT 44.3 42.0 38.5*  PLT 158  --  121*   BMET  Recent Labs  04/08/14 1723 04/08/14 1807 04/09/14 0112  NA 143 142 143  K 4.0 3.5* 4.2  CL 104 110 107  CO2 24  --  25  GLUCOSE 95 100* 133*  BUN 14 13 13   CREATININE 1.12 1.20 1.06  CALCIUM 9.3  --  8.4    Recent Labs  04/09/14 0012 04/09/14 0718  TROPONINI >20.00* >20.00*    Lab Results  Component Value Date   CHOL 130 04/09/2014   HDL 27* 04/09/2014   LDLCALC 81 04/09/2014   TRIG 112 04/09/2014   CHOLHDL 4.8 04/09/2014   No results found for this basename: HGBA1C     No results found for this basename: TSH    Hepatic Function Panel No results found for this basename: PROT, ALBUMIN, AST, ALT, ALKPHOS, BILITOT,  BILIDIR, IBILI,  in the last 72 hours  Recent Labs  04/09/14 0112  CHOL 130   No results found for this basename: PROTIME,  in the last 72 hours     Studies/Results: Dg Chest Port 1 View  04/08/2014   CLINICAL DATA:  Shortness of breath.  Chest pain.  EXAM: PORTABLE CHEST - 1 VIEW  COMPARISON:  Chest radiograph July 08, 2011  FINDINGS: Cardiomediastinal silhouette is normal, mildly tortuous aorta. Mild chronic interstitial changes. The lungs are otherwise clear without pleural effusions or focal consolidations. Trachea projects midline and there is no pneumothorax. Soft tissue planes and included osseous structures are non-suspicious.  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   Electronically Signed   By: Elon Alas   On: 04/08/2014 20:13    Medications: I have reviewed the patient's current medications. Scheduled Meds: . aspirin EC  81 mg Oral Daily  . atorvastatin  80 mg Oral q1800  . carvedilol  3.125 mg Oral BID WC  . Chlorhexidine Gluconate Cloth  6 each Topical Q0600  . cholecalciferol  1,000 Units Oral Daily  . mupirocin ointment  1 application Nasal BID  . pantoprazole  40 mg Oral Daily  . ticagrelor  90 mg Oral BID  . tiotropium  18 mcg Inhalation Daily   Continuous  Infusions:  PRN Meds:.acetaminophen, nitroGLYCERIN, ondansetron (ZOFRAN) IV  Assessment/Plan:  Admitted 04/08/14 Principal Problem:   ST elevation myocardial infarction (STEMI) of inferior wall, initial episode of care, Pk troponin > 20 EF 55% -- on ASA statin, coreg, brilinta- ambulated with cardiac rehab Active Problems:   S/P angioplasty with stent mRCA with BMS- vision- BMS used due to pt needing dental work and dilatation of esophagus.   Hypertension   Hypercholesteremia Cholesterol is noted to be quite low, total 130, LDL 81. He was on pravachol but now on high dose lipitor for acute plaque stabilization.     NSVT (nonsustained ventricular tachycardia)    Tobacco use- pt stated he has stopped smoking with this MI   CAD in native artery, mild non obstructive residual diseas   SB-- HR in the 40s during the night.  Currently 66-- he does have a hx of bradycardia, changed to coreg yesterday at 3.125 bid for shourt runs of NSVT  Probable discharge.   LOS: 2 days   Time spent with pt. :15 minutes. [facility] R  Nurse Practitioner Certified Pager [phone] or after 5pm and on weekends call [phone] 04/10/2014, 11:30 AM   Patient seen and examined. Agree with assessment and plan. No recurrent chest pain. Day 2 s/p IMI/PCI RCA. Currently sinus rhythm in 60 - 70's; continue coreg. DC today   Troy Sine, MD, [facility] 04/10/2014 12:08 PM

## 2014-04-30 ENCOUNTER — Ambulatory Visit (INDEPENDENT_AMBULATORY_CARE_PROVIDER_SITE_OTHER): Payer: Medicare Other | Admitting: Cardiology

## 2014-04-30 ENCOUNTER — Encounter: Payer: Self-pay | Admitting: Cardiology

## 2014-04-30 VITALS — BP 118/76 | HR 66 | Ht 72.0 in | Wt 211.0 lb

## 2014-04-30 DIAGNOSIS — I2119 ST elevation (STEMI) myocardial infarction involving other coronary artery of inferior wall: Secondary | ICD-10-CM

## 2014-04-30 DIAGNOSIS — C61 Malignant neoplasm of prostate: Secondary | ICD-10-CM

## 2014-04-30 DIAGNOSIS — J449 Chronic obstructive pulmonary disease, unspecified: Secondary | ICD-10-CM | POA: Insufficient documentation

## 2014-04-30 DIAGNOSIS — I10 Essential (primary) hypertension: Secondary | ICD-10-CM

## 2014-04-30 DIAGNOSIS — Z95820 Peripheral vascular angioplasty status with implants and grafts: Secondary | ICD-10-CM

## 2014-04-30 DIAGNOSIS — H9190 Unspecified hearing loss, unspecified ear: Secondary | ICD-10-CM

## 2014-04-30 DIAGNOSIS — I241 Dressler's syndrome: Secondary | ICD-10-CM

## 2014-04-30 DIAGNOSIS — Z9889 Other specified postprocedural states: Secondary | ICD-10-CM

## 2014-04-30 DIAGNOSIS — E785 Hyperlipidemia, unspecified: Secondary | ICD-10-CM

## 2014-04-30 DIAGNOSIS — J438 Other emphysema: Secondary | ICD-10-CM

## 2014-04-30 DIAGNOSIS — I2510 Atherosclerotic heart disease of native coronary artery without angina pectoris: Secondary | ICD-10-CM | POA: Insufficient documentation

## 2014-04-30 NOTE — Patient Instructions (Addendum)
Your physician has recommended you make the following change in your medication:   1. AFTER ONE MONTH (05/11/14) STOP [place] PROCEDURE      CONTACT GASTROENTEROLOGIST FOR PROCEDURE    Your physician recommends that you schedule a follow-up appointment in:  WITH DR Irish Lack IN TWO MONTHS   RECOMMENDS YOU  ATTEND  CARDIO REHAB AND SOME WILL CONTACT YOU FROM  CARDIAC REHAB WITH THAT INFORMATION

## 2014-04-30 NOTE — Assessment & Plan Note (Signed)
On Spiriva but doesn't always use it

## 2014-04-30 NOTE — Assessment & Plan Note (Signed)
Initial post hospital follow up- doing well

## 2014-04-30 NOTE — Assessment & Plan Note (Signed)
He believes it was the RCA then as well

## 2014-04-30 NOTE — Progress Notes (Signed)
04/30/2014 ESAU West   04/12/1941  [id number]  Primary Physicia Angel Panda, MD Primary Cardiologist: Dr Angel West (new)  HPI:  73 y/o male with a history of remote RCA PTCA (Dr Angel West was his doctor then). He had no significant cardiac issues since. He presented 04/08/14 with an inferior STEMI. He received a BMS to his RCA. He had NL LVF and mild residual disease. He has an esophageal stricture and needs dental work- hence the BMS. Since discharge he has done well. He is walking 30-40 minutes a day without chest pain.  Current Outpatient Prescriptions  Medication Sig Dispense Refill  . aspirin EC 81 MG EC tablet Take 1 tablet (81 mg total) by mouth daily.      Marland Kitchen atorvastatin (LIPITOR) 80 MG tablet Take 1 tablet (80 mg total) by mouth daily at 6 PM.  30 tablet  6  . BIOTIN PO Take 1 tablet by mouth daily.      Marland Kitchen CALCIUM PO Take 1 tablet by mouth daily.      . carvedilol (COREG) 3.125 MG tablet Take 1 tablet (3.125 mg total) by mouth 2 (two) times daily with a meal.  60 tablet  6  . cholecalciferol (VITAMIN D) 1000 UNITS tablet Take 1,000 Units by mouth daily.        Marland Kitchen MAGNESIUM PO Take 1 tablet by mouth daily with supper.      . nitroGLYCERIN (NITROSTAT) 0.4 MG SL tablet Place 1 tablet (0.4 mg total) under the tongue every 5 (five) minutes x 3 doses as needed for chest pain.  25 tablet  4  . NON FORMULARY Take 1 tablet by mouth daily with supper. Formula one      . pantoprazole (PROTONIX) 40 MG tablet Take 1 tablet (40 mg total) by mouth daily.  30 tablet  2  . ticagrelor (BRILINTA) 90 MG TABS tablet Take 1 tablet (90 mg total) by mouth 2 (two) times daily.  60 tablet  2  . tiotropium (SPIRIVA) 18 MCG inhalation capsule Place 1 capsule (18 mcg total) into inhaler and inhale daily.  30 capsule  12   No current facility-administered medications for this visit.    No Known Allergies  History   Social History  . Marital Status: Married    Spouse Name: N/A    Number  of Children: N/A  . Years of Education: N/A   Occupational History  . Not on file.   Social History Main Topics  . Smoking status: Current Every Day Smoker -- 1.00 packs/day    Types: Cigars  . Smokeless tobacco: Not on file  . Alcohol Use: No  . Drug Use: No  . Sexual Activity: Not on file   Other Topics Concern  . Not on file   Social History Narrative  . No narrative on file     Review of Systems: General: negative for chills, fever, night sweats or weight changes.  Cardiovascular: negative for chest pain, dyspnea on exertion, edema, orthopnea, palpitations, paroxysmal nocturnal dyspnea or shortness of breath Dermatological: negative for rash Respiratory: negative for cough or wheezing Urologic: negative for hematuria Abdominal: negative for nausea, vomiting, diarrhea, bright red blood per rectum, melena, or hematemesis Neurologic: negative for visual changes, syncope, or dizziness He has trouble eating secondary to dental problem and trouble swallowing secondary to his esophageal stricture.  All other systems reviewed and are otherwise negative except as noted above.    Blood pressure 118/76, pulse 66, height 6' (1.829 m),  weight 211 lb (95.709 kg).  General appearance: alert, cooperative and no distress Neck: no carotid bruit and no JVD Lungs: clear to auscultation bilaterally Heart: regular rate and rhythm  EKG- NSR, SB, TWI 3, AVF  ASSESSMENT AND PLAN:   S/P angioplasty with stent mRCA with BMS 04/08/14 Initial post hospital follow up- doing well  COPD (chronic obstructive pulmonary disease) On Spiriva but doesn't always use it  Hypertension Controlled  Dyslipidemia- low HDL He is now on Lipitor 80 mg- previously on Pravastatin  CAD- hx of remote RCA PTCA 1988 He believes it was the RCA then as well   PLAN  I discussed his medications in detail with the pt and his wife. He will continue with Lipitor 80 mg and let us know if he has problems with this. I  discussed with Dr Caryl Comes timing of endoscopy and dental work. He feels the pt can safely undergo dental work and endoscopy/ dilatation when he is off [place]. He'll follow up with Dr Angel West in 2 months, he should have his lipids and LFTs checked then.   Angel West KPA-C 04/30/2014 9:46 AM

## 2014-04-30 NOTE — Assessment & Plan Note (Signed)
Controlled.  

## 2014-04-30 NOTE — Assessment & Plan Note (Signed)
He is now on Lipitor 80 mg- previously on Pravastatin

## 2014-05-05 ENCOUNTER — Ambulatory Visit (HOSPITAL_COMMUNITY): Admission: RE | Admit: 2014-05-05 | Payer: Medicare Other | Source: Ambulatory Visit | Admitting: Gastroenterology

## 2014-05-05 ENCOUNTER — Encounter (HOSPITAL_COMMUNITY): Admission: RE | Payer: Self-pay | Source: Ambulatory Visit

## 2014-05-05 SURGERY — EGD (ESOPHAGOGASTRODUODENOSCOPY)
Anesthesia: Moderate Sedation

## 2014-05-22 ENCOUNTER — Telehealth (HOSPITAL_COMMUNITY): Payer: Self-pay | Admitting: *Deleted

## 2014-05-22 NOTE — Telephone Encounter (Signed)
Received signed MD order for rehab.  Message left to please contact to sign up for cardiac rehab. Contact information left.

## 2014-05-26 ENCOUNTER — Encounter (HOSPITAL_COMMUNITY): Payer: Medicare Other

## 2014-05-28 ENCOUNTER — Encounter (HOSPITAL_COMMUNITY): Payer: Medicare Other

## 2014-05-29 ENCOUNTER — Encounter (HOSPITAL_COMMUNITY)
Admission: RE | Admit: 2014-05-29 | Discharge: 2014-05-29 | Disposition: A | Discharge status: Home / Self Care | Payer: Medicare Other | Source: Ambulatory Visit | Attending: Interventional Cardiology | Admitting: Interventional Cardiology

## 2014-05-29 DIAGNOSIS — I2510 Atherosclerotic heart disease of native coronary artery without angina pectoris: Secondary | ICD-10-CM | POA: Insufficient documentation

## 2014-05-29 DIAGNOSIS — I252 Old myocardial infarction: Secondary | ICD-10-CM | POA: Insufficient documentation

## 2014-05-29 DIAGNOSIS — Z9861 Coronary angioplasty status: Secondary | ICD-10-CM | POA: Insufficient documentation

## 2014-05-29 DIAGNOSIS — I10 Essential (primary) hypertension: Secondary | ICD-10-CM | POA: Insufficient documentation

## 2014-05-29 NOTE — Progress Notes (Signed)
Cardiac Rehab Medication Review by a Pharmacist  Does the patient  feel that his/her medications are working for him/her?  yes  Has the patient been experiencing any side effects to the medications prescribed?  no  Does the patient measure his/her own blood pressure or blood glucose at home?  no   Does the patient have any problems obtaining medications due to transportation or finances?   no  Understanding of regimen: fair Understanding of indications: poor Potential of compliance: excellent  Pharmacist comments: 73 yo M presenting to cardiac rehab orientation with a fair understanding of his medication regimen but unsure of the reasons for each prescribed medication. Indications were reviewed with patient and he verbalized understanding. Pt is unsure why he has to to take medications multiple times a day and at specific times, reasons were explained with pt expressing understanding at the end of the session.   Thank you for allowing pharmacy to be part of this patient's care team  Jessica Seidman M. Teairra Millar, Pharm.D Clinical Pharmacy Resident Pager: [phone] 05/29/2014 .9:25 AM

## 2014-05-30 ENCOUNTER — Encounter (HOSPITAL_COMMUNITY): Payer: Medicare Other

## 2014-06-02 ENCOUNTER — Encounter (HOSPITAL_COMMUNITY): Payer: Medicare Other

## 2014-06-04 ENCOUNTER — Encounter (HOSPITAL_COMMUNITY)
Admission: RE | Admit: 2014-06-04 | Discharge: 2014-06-04 | Disposition: A | Discharge status: Home / Self Care | Payer: Medicare Other | Source: Ambulatory Visit | Attending: Interventional Cardiology | Admitting: Interventional Cardiology

## 2014-06-04 ENCOUNTER — Encounter (HOSPITAL_COMMUNITY): Payer: Self-pay

## 2014-06-04 DIAGNOSIS — I10 Essential (primary) hypertension: Secondary | ICD-10-CM | POA: Diagnosis not present

## 2014-06-04 DIAGNOSIS — I252 Old myocardial infarction: Secondary | ICD-10-CM | POA: Diagnosis not present

## 2014-06-04 DIAGNOSIS — I2510 Atherosclerotic heart disease of native coronary artery without angina pectoris: Secondary | ICD-10-CM | POA: Diagnosis not present

## 2014-06-04 DIAGNOSIS — Z9861 Coronary angioplasty status: Secondary | ICD-10-CM | POA: Diagnosis not present

## 2014-06-04 NOTE — Progress Notes (Addendum)
Pt started cardiac rehab today.  Pt tolerated light exercise without difficulty.  VSS, telemetry-NSR, occasional PVC. Asymptomatic.  PHQ-0.  Pt exhibits no barriers to rehab participation, has good coping skills, positive outlook and supportive family.  Pt quality of life assessment is unremarkable.  Pt reports he quit smoking the day of his heart attack and he has had no urges or cravings. He reports he has been able to quit smoking without difficulty. Pt offered emotional support, cessation counseling and praise for his efforts.  Pt rehab goals are to increase strength and learn exercise routine and limitations.  Pt has returned to work as an Optometrist.  Pt admits he has difficulty swallowing lipitor.  Pt advised ok to crush pill and mix with applesauce or pudding for easier swallowing.  Pt oriented to exercise equipment and routine.  Understanding verbalized.

## 2014-06-06 ENCOUNTER — Encounter (HOSPITAL_COMMUNITY)
Admission: RE | Admit: 2014-06-06 | Discharge: 2014-06-06 | Disposition: A | Discharge status: Home / Self Care | Payer: Medicare Other | Source: Ambulatory Visit | Attending: Interventional Cardiology | Admitting: Interventional Cardiology

## 2014-06-06 DIAGNOSIS — Z955 Presence of coronary angioplasty implant and graft: Secondary | ICD-10-CM | POA: Diagnosis present

## 2014-06-06 DIAGNOSIS — I213 ST elevation (STEMI) myocardial infarction of unspecified site: Secondary | ICD-10-CM | POA: Diagnosis present

## 2014-06-09 ENCOUNTER — Encounter (HOSPITAL_COMMUNITY)
Admission: RE | Admit: 2014-06-09 | Discharge: 2014-06-09 | Disposition: A | Discharge status: Home / Self Care | Payer: Medicare Other | Source: Ambulatory Visit | Attending: Interventional Cardiology | Admitting: Interventional Cardiology

## 2014-06-09 DIAGNOSIS — I213 ST elevation (STEMI) myocardial infarction of unspecified site: Secondary | ICD-10-CM | POA: Diagnosis not present

## 2014-06-11 ENCOUNTER — Encounter (HOSPITAL_COMMUNITY)
Admission: RE | Admit: 2014-06-11 | Discharge: 2014-06-11 | Disposition: A | Discharge status: Home / Self Care | Payer: Medicare Other | Source: Ambulatory Visit | Attending: Interventional Cardiology | Admitting: Interventional Cardiology

## 2014-06-11 DIAGNOSIS — I213 ST elevation (STEMI) myocardial infarction of unspecified site: Secondary | ICD-10-CM | POA: Diagnosis not present

## 2014-06-13 ENCOUNTER — Encounter (HOSPITAL_COMMUNITY)
Admission: RE | Admit: 2014-06-13 | Discharge: 2014-06-13 | Disposition: A | Discharge status: Home / Self Care | Payer: Medicare Other | Source: Ambulatory Visit | Attending: Interventional Cardiology | Admitting: Interventional Cardiology

## 2014-06-13 DIAGNOSIS — I213 ST elevation (STEMI) myocardial infarction of unspecified site: Secondary | ICD-10-CM | POA: Diagnosis not present

## 2014-06-16 ENCOUNTER — Encounter (HOSPITAL_COMMUNITY)
Admission: RE | Admit: 2014-06-16 | Discharge: 2014-06-16 | Disposition: A | Discharge status: Home / Self Care | Payer: Medicare Other | Source: Ambulatory Visit | Attending: Interventional Cardiology | Admitting: Interventional Cardiology

## 2014-06-16 DIAGNOSIS — I213 ST elevation (STEMI) myocardial infarction of unspecified site: Secondary | ICD-10-CM | POA: Diagnosis not present

## 2014-06-17 ENCOUNTER — Other Ambulatory Visit: Payer: Self-pay | Admitting: Gastroenterology

## 2014-06-18 ENCOUNTER — Encounter (HOSPITAL_COMMUNITY)
Admission: RE | Admit: 2014-06-18 | Discharge: 2014-06-18 | Disposition: A | Discharge status: Home / Self Care | Payer: Medicare Other | Source: Ambulatory Visit | Attending: Interventional Cardiology | Admitting: Interventional Cardiology

## 2014-06-18 DIAGNOSIS — I213 ST elevation (STEMI) myocardial infarction of unspecified site: Secondary | ICD-10-CM | POA: Diagnosis not present

## 2014-06-18 NOTE — Progress Notes (Signed)
I have reviewed home exercise with Angel West. The patient was advised to walk 2-4 days per week outside of CRP II for 30-45 minutes continuously.  Pt will also complete one additional day of hand weights outside of CRP II.  Progression of exercise prescription was discussed.  Reviewed THR, pulse, RPE, sign and symptoms, NTG use and when to call 911 or MD.  Pt voiced understanding.  [place], MS, ACSM RCEP 06/18/2014 2:25 PM

## 2014-06-20 ENCOUNTER — Encounter (HOSPITAL_COMMUNITY)
Admission: RE | Admit: 2014-06-20 | Discharge: 2014-06-20 | Disposition: A | Discharge status: Home / Self Care | Payer: Medicare Other | Source: Ambulatory Visit | Attending: Interventional Cardiology | Admitting: Interventional Cardiology

## 2014-06-20 DIAGNOSIS — I213 ST elevation (STEMI) myocardial infarction of unspecified site: Secondary | ICD-10-CM | POA: Diagnosis not present

## 2014-06-23 ENCOUNTER — Encounter (HOSPITAL_COMMUNITY)
Admission: RE | Admit: 2014-06-23 | Discharge: 2014-06-23 | Disposition: A | Discharge status: Home / Self Care | Payer: Medicare Other | Source: Ambulatory Visit | Attending: Interventional Cardiology | Admitting: Interventional Cardiology

## 2014-06-23 DIAGNOSIS — I213 ST elevation (STEMI) myocardial infarction of unspecified site: Secondary | ICD-10-CM | POA: Diagnosis not present

## 2014-06-23 NOTE — Progress Notes (Signed)
Angel West 73 y.o. male Nutrition Note Spoke with pt.  Nutrition Survey reviewed with pt. Pt is following Step 1 of the Therapeutic Lifestyle Changes diet. Pt wants to lose wt. Pt states he lost 30 lb 2.5 years ago by following the [employer] [facility] loss program. Pt c/o gaining 5 lb since quitting tobacco.  Wt loss tips reviewed. According to pt nutrition screen, pt c/o chewing difficulties due to "waiting for my bridge" and swallowing difficulties due to esophageal stricture. Pt states he is to have his esophagus stretched 07/02/14. Pt encouraged to drink warm water, eat soft foods, and swallow using the "chin-tuck" technique to help pt tolerate his diet until his chewing/swallowing issues are resolved. Pt expressed understanding of the information reviewed. Pt aware of nutrition education classes offered.  Nutrition Diagnosis   Food-and nutrition-related knowledge deficit related to lack of exposure to information as related to diagnosis of: ? CVD    Obesity related to excessive energy intake as evidenced by a BMI of 30.6  Nutrition Intervention   Benefits of adopting Therapeutic Lifestyle Changes discussed when Medficts reviewed.   Pt to attend the Portion Distortion class - met; 06/04/14   Pt given handouts for: ? Nutrition I class ? Nutrition II class   Continue client-centered nutrition education by RD, as part of interdisciplinary care.  Goal(s)   Pt to identify and limit food sources of saturated fat, trans fat, and cholesterol   Pt to identify food quantities necessary to achieve: ? wt loss to a goal wt of 189-207 lb (85.9-94.1 kg) at graduation from cardiac rehab.   Monitor and Evaluate progress toward nutrition goal with team.   Derek Mound, M.Ed, RD, LDN, CDE 06/23/2014 2:13 PM

## 2014-06-25 ENCOUNTER — Encounter (HOSPITAL_COMMUNITY)
Admission: RE | Admit: 2014-06-25 | Discharge: 2014-06-25 | Disposition: A | Discharge status: Home / Self Care | Payer: Medicare Other | Source: Ambulatory Visit | Attending: Interventional Cardiology | Admitting: Interventional Cardiology

## 2014-06-25 DIAGNOSIS — I213 ST elevation (STEMI) myocardial infarction of unspecified site: Secondary | ICD-10-CM | POA: Diagnosis not present

## 2014-06-27 ENCOUNTER — Encounter (HOSPITAL_COMMUNITY)
Admission: RE | Admit: 2014-06-27 | Discharge: 2014-06-27 | Disposition: A | Discharge status: Home / Self Care | Payer: Medicare Other | Source: Ambulatory Visit | Attending: Interventional Cardiology | Admitting: Interventional Cardiology

## 2014-06-27 DIAGNOSIS — I213 ST elevation (STEMI) myocardial infarction of unspecified site: Secondary | ICD-10-CM | POA: Diagnosis not present

## 2014-06-30 ENCOUNTER — Encounter (HOSPITAL_COMMUNITY)
Admission: RE | Admit: 2014-06-30 | Discharge: 2014-06-30 | Disposition: A | Discharge status: Home / Self Care | Payer: Medicare Other | Source: Ambulatory Visit | Attending: Interventional Cardiology | Admitting: Interventional Cardiology

## 2014-06-30 DIAGNOSIS — I213 ST elevation (STEMI) myocardial infarction of unspecified site: Secondary | ICD-10-CM | POA: Diagnosis not present

## 2014-07-01 ENCOUNTER — Encounter (HOSPITAL_COMMUNITY): Payer: Self-pay | Admitting: *Deleted

## 2014-07-01 ENCOUNTER — Ambulatory Visit (HOSPITAL_COMMUNITY)
Admission: RE | Admit: 2014-07-01 | Discharge: 2014-07-01 | Disposition: A | Discharge status: Home / Self Care | Payer: Medicare Other | Source: Ambulatory Visit | Attending: Gastroenterology | Admitting: Gastroenterology

## 2014-07-01 ENCOUNTER — Encounter (HOSPITAL_COMMUNITY)
Admission: RE | Disposition: A | Discharge status: Home / Self Care | Payer: Self-pay | Source: Ambulatory Visit | Attending: Gastroenterology

## 2014-07-01 ENCOUNTER — Ambulatory Visit (HOSPITAL_COMMUNITY): Payer: Medicare Other

## 2014-07-01 DIAGNOSIS — Z8546 Personal history of malignant neoplasm of prostate: Secondary | ICD-10-CM | POA: Insufficient documentation

## 2014-07-01 DIAGNOSIS — K222 Esophageal obstruction: Secondary | ICD-10-CM | POA: Diagnosis not present

## 2014-07-01 DIAGNOSIS — K449 Diaphragmatic hernia without obstruction or gangrene: Secondary | ICD-10-CM | POA: Insufficient documentation

## 2014-07-01 DIAGNOSIS — K219 Gastro-esophageal reflux disease without esophagitis: Secondary | ICD-10-CM | POA: Insufficient documentation

## 2014-07-01 DIAGNOSIS — I2510 Atherosclerotic heart disease of native coronary artery without angina pectoris: Secondary | ICD-10-CM | POA: Diagnosis not present

## 2014-07-01 DIAGNOSIS — J449 Chronic obstructive pulmonary disease, unspecified: Secondary | ICD-10-CM | POA: Diagnosis not present

## 2014-07-01 DIAGNOSIS — R1310 Dysphagia, unspecified: Secondary | ICD-10-CM | POA: Diagnosis present

## 2014-07-01 DIAGNOSIS — I10 Essential (primary) hypertension: Secondary | ICD-10-CM | POA: Diagnosis not present

## 2014-07-01 DIAGNOSIS — I471 Supraventricular tachycardia: Secondary | ICD-10-CM | POA: Insufficient documentation

## 2014-07-01 DIAGNOSIS — F17210 Nicotine dependence, cigarettes, uncomplicated: Secondary | ICD-10-CM | POA: Insufficient documentation

## 2014-07-01 DIAGNOSIS — I252 Old myocardial infarction: Secondary | ICD-10-CM | POA: Insufficient documentation

## 2014-07-01 DIAGNOSIS — E785 Hyperlipidemia, unspecified: Secondary | ICD-10-CM | POA: Diagnosis not present

## 2014-07-01 DIAGNOSIS — K269 Duodenal ulcer, unspecified as acute or chronic, without hemorrhage or perforation: Secondary | ICD-10-CM | POA: Diagnosis not present

## 2014-07-01 HISTORY — PX: ESOPHAGOGASTRODUODENOSCOPY: SHX5428

## 2014-07-01 HISTORY — PX: BALLOON DILATION: SHX5330

## 2014-07-01 SURGERY — EGD (ESOPHAGOGASTRODUODENOSCOPY)
Anesthesia: Moderate Sedation

## 2014-07-01 MED ORDER — LACTATED RINGERS IV SOLN
INTRAVENOUS | Status: DC
  Administered 2014-07-01: 1000 mL via INTRAVENOUS

## 2014-07-01 MED ORDER — ONDANSETRON HCL 4 MG/2ML IJ SOLN
INTRAMUSCULAR | Status: AC
  Filled 2014-07-01: qty 2

## 2014-07-01 MED ORDER — ONDANSETRON HCL 4 MG/2ML IJ SOLN
4.0000 mg | Freq: Once | INTRAMUSCULAR | Status: AC
  Administered 2014-07-01: 4 mg via INTRAVENOUS

## 2014-07-01 MED ORDER — FENTANYL CITRATE 0.05 MG/ML IJ SOLN
INTRAMUSCULAR | Status: AC
  Filled 2014-07-01: qty 2

## 2014-07-01 MED ORDER — SODIUM CHLORIDE 0.9 % IV SOLN: INTRAVENOUS | Status: DC

## 2014-07-01 MED ORDER — FENTANYL CITRATE 0.05 MG/ML IJ SOLN
INTRAMUSCULAR | Status: DC | PRN
  Administered 2014-07-01: 50 ug via INTRAVENOUS

## 2014-07-01 MED ORDER — MIDAZOLAM HCL 10 MG/2ML IJ SOLN
INTRAMUSCULAR | Status: DC | PRN
  Administered 2014-07-01 (×2): 2.5 mg via INTRAVENOUS

## 2014-07-01 MED ORDER — MIDAZOLAM HCL 10 MG/2ML IJ SOLN
INTRAMUSCULAR | Status: AC
  Filled 2014-07-01: qty 2

## 2014-07-01 NOTE — Discharge Instructions (Signed)
Esophagogastroduodenoscopy °Care After °Refer to this sheet in the next few weeks. These instructions provide you with information on caring for yourself after your procedure. Your caregiver may also give you more specific instructions. Your treatment has been planned according to current medical practices, but problems sometimes occur. Call your caregiver if you have any problems or questions after your procedure.  °HOME CARE INSTRUCTIONS °· Do not eat or drink anything until the numbing medicine (local anesthetic) has worn off and your gag reflex has returned. You will know that the local anesthetic has worn off when you can swallow comfortably. °· Do not drive for 12 hours after the procedure or as directed by your caregiver. °· Only take medicines as directed by your caregiver. °SEEK MEDICAL CARE IF:  °· You cannot stop coughing. °· You are not urinating at all or less than usual. °SEEK IMMEDIATE MEDICAL CARE IF: °· You have difficulty swallowing. °· You cannot eat or drink. °· You have worsening throat or chest pain. °· You have dizziness, lightheadedness, or you faint. °· You have nausea or vomiting. °· You have chills. °· You have a fever. °· You have severe abdominal pain. °· You have black, tarry, or bloody stools. °Document Released: 08/08/2012 Document Reviewed: 08/08/2012 °ExitCare® Patient Information ©2015 [employer]. This information is not intended to replace advice given to you by your health care provider. Make sure you discuss any questions you have with your health care provider. ° ° °Conscious Sedation, Adult, Care After °Refer to this sheet in the next few weeks. These instructions provide you with information on caring for yourself after your procedure. Your health care provider may also give you more specific instructions. Your treatment has been planned according to current medical practices, but problems sometimes occur. Call your health care provider if you have any problems or  questions after your procedure. °WHAT TO EXPECT AFTER THE PROCEDURE  °After your procedure: °· You may feel sleepy, clumsy, and have poor balance for several hours. °· Vomiting may occur if you eat too soon after the procedure. °HOME CARE INSTRUCTIONS °· Do not participate in any activities where you could become injured for at least 24 hours. Do not: °¨ Drive. °¨ Swim. °¨ Ride a bicycle. °¨ Operate heavy machinery. °¨ Cook. °¨ Use power tools. °¨ Climb ladders. °¨ Work from a high place. °· Do not make important decisions or sign legal documents until you are improved. °· If you vomit, drink water, juice, or soup when you can drink without vomiting. Make sure you have little or no nausea before eating solid foods. °· Only take over-the-counter or prescription medicines for pain, discomfort, or fever as directed by your health care provider. °· Make sure you and your family fully understand everything about the medicines given to you, including what side effects may occur. °· You should not drink alcohol, take sleeping pills, or take medicines that cause drowsiness for at least 24 hours. °· If you smoke, do not smoke without supervision. °· If you are feeling better, you may resume normal activities 24 hours after you were sedated. °· Keep all appointments with your health care provider. °SEEK MEDICAL CARE IF: °· Your skin is pale or bluish in color. °· You continue to feel nauseous or vomit. °· Your pain is getting worse and is not helped by medicine. °· You have bleeding or swelling. °· You are still sleepy or feeling clumsy after 24 hours. °SEEK IMMEDIATE MEDICAL CARE IF: °· You develop a   rash. °· You have difficulty breathing. °· You develop any type of allergic problem. °· You have a fever. °MAKE SURE YOU: °· Understand these instructions. °· Will watch your condition. °· Will get help right away if you are not doing well or get worse. °Document Released: 06/12/2013 Document Reviewed: 06/12/2013 °ExitCare®  Patient Information ©2015 [employer]. This information is not intended to replace advice given to you by your health care provider. Make sure you discuss any questions you have with your health care provider. ° °

## 2014-07-01 NOTE — Op Note (Signed)
Problem: Dysphagia associated with a benign stricture at the esophagogastric junction  Endoscopist: Earle Gell  Premedication: Versed 5 mg. Fentanyl 50 g.  Procedure: Diagnostic esophagogastroduodenoscopy with esophageal balloon dilation of a benign stricture at the esophagogastric junction The patient was placed in the left lateral decubitus position. The Pentax gastroscope was passed through the posterior hypopharynx into the proximal esophagus without difficulty. I did not visualize the vocal cords.  Esophagoscopy: The proximal and mid segments of the esophageal mucosa appeared normal. The squamocolumnar junction was noted at 40 cm from the incisor teeth. At the esophagogastric junction was a benign stricture less than 1 cm in length. I was able to traverse the stricture with the Pentax gastroscope. Using the esophageal balloon dilator, the stricture was dilated from 12 mm to 15 mm without apparent complications.  Gastroscopy: There was a moderate sized hiatal hernia. The diaphragmatic hiatus was patulous. Retroflexed view of the gastric cardia and fundus was normal. The gastric body, antrum, and pylorus appeared normal.  Duodenoscopy: There were scattered erosions without bleeding in the distal duodenal bulb and second portion of the duodenum  Assessment:  #1. Chronic gastroesophageal reflux associated with a hiatal hernia and complicated by a benign stricture at the esophagogastric junction dilated to 15 mm using the esophageal balloon dilator  #2. Scattered erosions in the duodenal bulb and second portion of duodenum  Recommendation: Resume taking her proton pump inhibitor therapy before breakfast each morning.

## 2014-07-01 NOTE — H&P (Signed)
  Problem: Esophageal dysphagia associated with a benign distal esophageal stricture  History: The patient is a 73 year old male born 1941-05-04. He underwent a normal screening colonoscopy in August 2007.  The patient has a benign stricture at the esophagogastric junction which required dilation in December 2012.  With dieting, the patient has lost 30 pounds and has not been experiencing heartburn but has redeveloped esophageal dysphagia. He currently does not take proton pump inhibitor therapy.  The patient is scheduled to undergo diagnostic esophagogastroduodenoscopy and esophageal dilation today.  Medication allergies: None  Past medical history: Hypercholesterolemia. Hypertension. Coronary artery disease. Coronary artery angioplasty with stent placement. Prostate cancer. Benign distal esophageal stricture. Normal screening colonoscopy in 2007.  Exam: The patient is alert and lying comfortably on the endoscopy stretcher. Abdomen is soft and nontender to palpation. Lungs are clear to auscultation. Cardiac exam reveals a regular rhythm.  Plan: Proceed with diagnostic esophagogastroduodenoscopy with possible esophageal stricture dilation

## 2014-07-01 NOTE — Progress Notes (Signed)
Pt was nauseated after procedure and had positive emesis x1. Dr. Wynetta Emery notified of VSS. O2 sat 96% on RA. Ordered zofran 4mg /IV. Tolerated well. [facility]

## 2014-07-02 ENCOUNTER — Encounter: Payer: Self-pay | Admitting: Interventional Cardiology

## 2014-07-02 ENCOUNTER — Ambulatory Visit (INDEPENDENT_AMBULATORY_CARE_PROVIDER_SITE_OTHER): Payer: Medicare Other | Admitting: Interventional Cardiology

## 2014-07-02 ENCOUNTER — Encounter (HOSPITAL_COMMUNITY)
Admission: RE | Admit: 2014-07-02 | Discharge: 2014-07-02 | Disposition: A | Discharge status: Home / Self Care | Payer: Medicare Other | Source: Ambulatory Visit | Attending: Interventional Cardiology | Admitting: Interventional Cardiology

## 2014-07-02 VITALS — BP 132/82 | HR 69 | Ht 71.0 in | Wt 213.4 lb

## 2014-07-02 DIAGNOSIS — M79672 Pain in left foot: Secondary | ICD-10-CM | POA: Insufficient documentation

## 2014-07-02 DIAGNOSIS — I10 Essential (primary) hypertension: Secondary | ICD-10-CM

## 2014-07-02 DIAGNOSIS — I213 ST elevation (STEMI) myocardial infarction of unspecified site: Secondary | ICD-10-CM | POA: Diagnosis not present

## 2014-07-02 DIAGNOSIS — E785 Hyperlipidemia, unspecified: Secondary | ICD-10-CM

## 2014-07-02 DIAGNOSIS — I2119 ST elevation (STEMI) myocardial infarction involving other coronary artery of inferior wall: Secondary | ICD-10-CM

## 2014-07-02 DIAGNOSIS — I2510 Atherosclerotic heart disease of native coronary artery without angina pectoris: Secondary | ICD-10-CM

## 2014-07-02 NOTE — Patient Instructions (Signed)
Your physician recommends that you return for FASTING lab work: on a day that is good for you- lipid/liver  Your physician wants you to follow-up in: 6 months with Dr. Irish Lack. You will receive a reminder letter in the mail two months in advance. If you don't receive a letter, please call our office to schedule the follow-up appointment.  Your physician recommends that you continue on your current medications as directed. Please refer to the Current Medication list given to you today.

## 2014-07-02 NOTE — Progress Notes (Signed)
Patient ID: Angel West, male   DOB: [birth year], 73 y.o.   MRN: [id number]    07/02/2014 NENG ALBEE   07/18/1941  [id number]  Primary Physicia [name], MD Primary Cardiologist: Dr Irish Lack   HPI:  73 y/o male with a history of remote RCA PTCA (Dr Karolee Ohs was his doctor then). He had no significant cardiac issues since. He presented 04/08/14 with an inferior STEMI. He received a BMS to his RCA. He had NL LVF and mild residual disease. He has an esophageal stricture and needs dental work- hence the BMS. Since discharge he has done well. He is walking 30-40 minutes a day without chest pain.  He had an esophageal dilation in 10/15.  He has dental work coming up as well.    Current Outpatient Prescriptions  Medication Sig Dispense Refill  . aspirin EC 81 MG EC tablet Take 1 tablet (81 mg total) by mouth daily.      Marland Kitchen atorvastatin (LIPITOR) 80 MG tablet Take 1 tablet (80 mg total) by mouth daily at 6 PM.  30 tablet  6  . carvedilol (COREG) 3.125 MG tablet Take 1 tablet (3.125 mg total) by mouth 2 (two) times daily with a meal.  60 tablet  6  . cholecalciferol (VITAMIN D) 1000 UNITS tablet Take 1,000 Units by mouth daily.        Marland Kitchen MAGNESIUM PO Take 1 tablet by mouth daily with supper.      . nitroGLYCERIN (NITROSTAT) 0.4 MG SL tablet Place 1 tablet (0.4 mg total) under the tongue every 5 (five) minutes x 3 doses as needed for chest pain.  25 tablet  4  . NON FORMULARY Take 1 tablet by mouth daily with supper. Formula one      . tiotropium (SPIRIVA) 18 MCG inhalation capsule Place 1 capsule (18 mcg total) into inhaler and inhale daily.  30 capsule  12   No current facility-administered medications for this visit.    No Known Allergies  History   Social History  . Marital Status: Married    Spouse Name: N/A    Number of Children: N/A  . Years of Education: N/A   Occupational History  . Not on file.   Social History Main Topics  . Smoking status: Former  Smoker -- 1.00 packs/day    Types: Cigars    Quit date: 04/08/2014  . Smokeless tobacco: Not on file     Comment: pt reports he has quit smoking without difficulty or urges  . Alcohol Use: No  . Drug Use: No  . Sexual Activity: Not on file   Other Topics Concern  . Not on file   Social History Narrative  . No narrative on file     Review of Systems: General: negative for chills, fever, night sweats or weight changes.  Cardiovascular: negative for chest pain, dyspnea on exertion, edema, orthopnea, palpitations, paroxysmal nocturnal dyspnea or shortness of breath Dermatological: negative for rash Respiratory: negative for cough or wheezing Urologic: negative for hematuria Abdominal: negative for nausea, vomiting, diarrhea, bright red blood per rectum, melena, or hematemesis Neurologic: negative for visual changes, syncope, or dizziness He has trouble eating secondary to dental problem and trouble swallowing secondary to his esophageal stricture- hopefully will be better after stretching procedure yesterday.  All other systems reviewed and are otherwise negative except as noted above.    Blood pressure 132/82, pulse 69, height 5\' 11"  (1.803 m), weight 213 lb 6.4 oz (96.798 kg).  General appearance:  alert, cooperative and no distress Neck: no carotid bruit and no JVD Lungs: clear to auscultation bilaterally Heart: regular rate and rhythm 2+ right radial pulse; 3+ left DP pulse No LE edema  EKG- NSR, SB, TWI 3, AVF  ASSESSMENT AND PLAN:     PLAN    Inferior Mi in 8/15: BMS placed due to planned procedures.  Off P2Y12 inhibitor.  COntinue aspirin.  Continue regular exercise.  Watch for any sx like his prior MI.    Hyperlipidemia: Check fasting lipid profile. May be able to reduce lipitor dose.   Left fot pain: 2+ left DP pulse.  No lack of blood flow noted.    HTN: BP controlled.  Checked at rehab. COntinue carvedilol given old MI.  Lakely Elmendorf  S. 07/02/2014 11:43 AM

## 2014-07-03 ENCOUNTER — Encounter (HOSPITAL_COMMUNITY): Payer: Self-pay | Admitting: Gastroenterology

## 2014-07-04 ENCOUNTER — Other Ambulatory Visit (INDEPENDENT_AMBULATORY_CARE_PROVIDER_SITE_OTHER): Payer: Medicare Other | Admitting: *Deleted

## 2014-07-04 ENCOUNTER — Encounter (HOSPITAL_COMMUNITY)
Admission: RE | Admit: 2014-07-04 | Discharge: 2014-07-04 | Disposition: A | Discharge status: Home / Self Care | Payer: Medicare Other | Source: Ambulatory Visit | Attending: Interventional Cardiology | Admitting: Interventional Cardiology

## 2014-07-04 DIAGNOSIS — I213 ST elevation (STEMI) myocardial infarction of unspecified site: Secondary | ICD-10-CM | POA: Diagnosis not present

## 2014-07-04 DIAGNOSIS — I2510 Atherosclerotic heart disease of native coronary artery without angina pectoris: Secondary | ICD-10-CM

## 2014-07-04 LAB — HEPATIC FUNCTION PANEL
ALBUMIN: 3.3 g/dL — AB (ref 3.5–5.2)
ALT: 16 U/L (ref 0–53)
AST: 21 U/L (ref 0–37)
Alkaline Phosphatase: 57 U/L (ref 39–117)
BILIRUBIN TOTAL: 0.8 mg/dL (ref 0.2–1.2)
Bilirubin, Direct: 0.1 mg/dL (ref 0.0–0.3)
Total Protein: 7.1 g/dL (ref 6.0–8.3)

## 2014-07-04 LAB — LIPID PANEL
CHOLESTEROL: 97 mg/dL (ref 0–200)
HDL: 29 mg/dL — AB (ref >39.00)
LDL CALC: 54 mg/dL (ref 0–99)
NonHDL: 68
TRIGLYCERIDES: 71 mg/dL (ref 0.0–149.0)
Total CHOL/HDL Ratio: 3
VLDL: 14.2 mg/dL (ref 0.0–40.0)

## 2014-07-07 ENCOUNTER — Encounter (HOSPITAL_COMMUNITY)
Admission: RE | Admit: 2014-07-07 | Discharge: 2014-07-07 | Disposition: A | Discharge status: Home / Self Care | Payer: Medicare Other | Source: Ambulatory Visit | Attending: Interventional Cardiology | Admitting: Interventional Cardiology

## 2014-07-07 DIAGNOSIS — I213 ST elevation (STEMI) myocardial infarction of unspecified site: Secondary | ICD-10-CM | POA: Insufficient documentation

## 2014-07-07 DIAGNOSIS — Z955 Presence of coronary angioplasty implant and graft: Secondary | ICD-10-CM | POA: Insufficient documentation

## 2014-07-09 ENCOUNTER — Telehealth: Payer: Self-pay | Admitting: Interventional Cardiology

## 2014-07-09 ENCOUNTER — Encounter (HOSPITAL_COMMUNITY): Payer: Medicare Other

## 2014-07-09 DIAGNOSIS — Z79899 Other long term (current) drug therapy: Secondary | ICD-10-CM

## 2014-07-09 DIAGNOSIS — I213 ST elevation (STEMI) myocardial infarction of unspecified site: Secondary | ICD-10-CM | POA: Diagnosis not present

## 2014-07-09 DIAGNOSIS — E785 Hyperlipidemia, unspecified: Secondary | ICD-10-CM

## 2014-07-09 MED ORDER — ATORVASTATIN CALCIUM 40 MG PO TABS: 40.0000 mg | ORAL_TABLET | Freq: Every day | ORAL | Status: DC

## 2014-07-09 NOTE — Telephone Encounter (Signed)
Follow up    Returning call back to triage

## 2014-07-11 ENCOUNTER — Encounter (HOSPITAL_COMMUNITY)
Admission: RE | Admit: 2014-07-11 | Discharge: 2014-07-11 | Disposition: A | Discharge status: Home / Self Care | Payer: Medicare Other | Source: Ambulatory Visit | Attending: Interventional Cardiology | Admitting: Interventional Cardiology

## 2014-07-11 DIAGNOSIS — I213 ST elevation (STEMI) myocardial infarction of unspecified site: Secondary | ICD-10-CM | POA: Diagnosis not present

## 2014-07-11 NOTE — Progress Notes (Signed)
Nutrition Note Spoke with pt per RN request. Pt had questions re: most recent lipid panel. Lipid panel reviewed with pt. Pt confirmed MD decreased Lipitor to 40 mg/d, which pt is now taking. Pt expressed understanding of the information reviewed. Continue client-centered nutrition education by RD as part of interdisciplinary care.  Monitor and evaluate progress toward nutrition goal with team.  Derek Mound, M.Ed, RD, LDN, CDE 07/11/2014 2:07 PM

## 2014-07-14 ENCOUNTER — Encounter (HOSPITAL_COMMUNITY)
Admission: RE | Admit: 2014-07-14 | Discharge: 2014-07-14 | Disposition: A | Discharge status: Home / Self Care | Payer: Medicare Other | Source: Ambulatory Visit | Attending: Interventional Cardiology | Admitting: Interventional Cardiology

## 2014-07-14 DIAGNOSIS — I213 ST elevation (STEMI) myocardial infarction of unspecified site: Secondary | ICD-10-CM | POA: Diagnosis not present

## 2014-07-16 ENCOUNTER — Encounter (HOSPITAL_COMMUNITY)
Admission: RE | Admit: 2014-07-16 | Discharge: 2014-07-16 | Disposition: A | Discharge status: Home / Self Care | Payer: Medicare Other | Source: Ambulatory Visit | Attending: Interventional Cardiology | Admitting: Interventional Cardiology

## 2014-07-16 DIAGNOSIS — I213 ST elevation (STEMI) myocardial infarction of unspecified site: Secondary | ICD-10-CM | POA: Diagnosis not present

## 2014-07-18 ENCOUNTER — Encounter (HOSPITAL_COMMUNITY)
Admission: RE | Admit: 2014-07-18 | Discharge: 2014-07-18 | Disposition: A | Discharge status: Home / Self Care | Payer: Medicare Other | Source: Ambulatory Visit | Attending: Interventional Cardiology | Admitting: Interventional Cardiology

## 2014-07-18 DIAGNOSIS — I213 ST elevation (STEMI) myocardial infarction of unspecified site: Secondary | ICD-10-CM | POA: Diagnosis not present

## 2014-07-21 ENCOUNTER — Encounter (HOSPITAL_COMMUNITY)
Admission: RE | Admit: 2014-07-21 | Discharge: 2014-07-21 | Disposition: A | Discharge status: Home / Self Care | Payer: Medicare Other | Source: Ambulatory Visit | Attending: Interventional Cardiology | Admitting: Interventional Cardiology

## 2014-07-21 DIAGNOSIS — I213 ST elevation (STEMI) myocardial infarction of unspecified site: Secondary | ICD-10-CM | POA: Diagnosis not present

## 2014-07-23 ENCOUNTER — Encounter (HOSPITAL_COMMUNITY)
Admission: RE | Admit: 2014-07-23 | Discharge: 2014-07-23 | Disposition: A | Discharge status: Home / Self Care | Payer: Medicare Other | Source: Ambulatory Visit | Attending: Interventional Cardiology | Admitting: Interventional Cardiology

## 2014-07-23 DIAGNOSIS — I213 ST elevation (STEMI) myocardial infarction of unspecified site: Secondary | ICD-10-CM | POA: Diagnosis not present

## 2014-07-25 ENCOUNTER — Encounter (HOSPITAL_COMMUNITY)
Admission: RE | Admit: 2014-07-25 | Discharge: 2014-07-25 | Disposition: A | Discharge status: Home / Self Care | Payer: Medicare Other | Source: Ambulatory Visit | Attending: Interventional Cardiology | Admitting: Interventional Cardiology

## 2014-07-25 DIAGNOSIS — I213 ST elevation (STEMI) myocardial infarction of unspecified site: Secondary | ICD-10-CM | POA: Diagnosis not present

## 2014-07-28 ENCOUNTER — Encounter (HOSPITAL_COMMUNITY): Payer: Medicare Other

## 2014-07-30 ENCOUNTER — Encounter (HOSPITAL_COMMUNITY)
Admission: RE | Admit: 2014-07-30 | Discharge: 2014-07-30 | Disposition: A | Discharge status: Home / Self Care | Payer: Medicare Other | Source: Ambulatory Visit | Attending: Interventional Cardiology | Admitting: Interventional Cardiology

## 2014-07-30 DIAGNOSIS — I213 ST elevation (STEMI) myocardial infarction of unspecified site: Secondary | ICD-10-CM | POA: Diagnosis not present

## 2014-08-04 ENCOUNTER — Encounter (HOSPITAL_COMMUNITY)
Admission: RE | Admit: 2014-08-04 | Discharge: 2014-08-04 | Disposition: A | Discharge status: Home / Self Care | Payer: Medicare Other | Source: Ambulatory Visit | Attending: Interventional Cardiology | Admitting: Interventional Cardiology

## 2014-08-04 DIAGNOSIS — I213 ST elevation (STEMI) myocardial infarction of unspecified site: Secondary | ICD-10-CM | POA: Diagnosis not present

## 2014-08-06 ENCOUNTER — Encounter (HOSPITAL_COMMUNITY)
Admission: RE | Admit: 2014-08-06 | Discharge: 2014-08-06 | Disposition: A | Discharge status: Home / Self Care | Payer: Medicare Other | Source: Ambulatory Visit | Attending: Interventional Cardiology | Admitting: Interventional Cardiology

## 2014-08-06 DIAGNOSIS — Z955 Presence of coronary angioplasty implant and graft: Secondary | ICD-10-CM | POA: Diagnosis present

## 2014-08-06 DIAGNOSIS — I213 ST elevation (STEMI) myocardial infarction of unspecified site: Secondary | ICD-10-CM | POA: Insufficient documentation

## 2014-08-08 ENCOUNTER — Encounter (HOSPITAL_COMMUNITY)
Admission: RE | Admit: 2014-08-08 | Discharge: 2014-08-08 | Disposition: A | Discharge status: Home / Self Care | Payer: Medicare Other | Source: Ambulatory Visit | Attending: Interventional Cardiology | Admitting: Interventional Cardiology

## 2014-08-08 DIAGNOSIS — I213 ST elevation (STEMI) myocardial infarction of unspecified site: Secondary | ICD-10-CM | POA: Diagnosis not present

## 2014-08-11 ENCOUNTER — Encounter (HOSPITAL_COMMUNITY)
Admission: RE | Admit: 2014-08-11 | Discharge: 2014-08-11 | Disposition: A | Discharge status: Home / Self Care | Payer: Medicare Other | Source: Ambulatory Visit | Attending: Interventional Cardiology | Admitting: Interventional Cardiology

## 2014-08-11 DIAGNOSIS — I213 ST elevation (STEMI) myocardial infarction of unspecified site: Secondary | ICD-10-CM | POA: Diagnosis not present

## 2014-08-13 ENCOUNTER — Encounter (HOSPITAL_COMMUNITY)
Admission: RE | Admit: 2014-08-13 | Discharge: 2014-08-13 | Disposition: A | Discharge status: Home / Self Care | Payer: Medicare Other | Source: Ambulatory Visit | Attending: Interventional Cardiology | Admitting: Interventional Cardiology

## 2014-08-13 DIAGNOSIS — I213 ST elevation (STEMI) myocardial infarction of unspecified site: Secondary | ICD-10-CM | POA: Diagnosis not present

## 2014-08-14 ENCOUNTER — Encounter (HOSPITAL_COMMUNITY): Payer: Self-pay | Admitting: Interventional Cardiology

## 2014-08-15 ENCOUNTER — Encounter (HOSPITAL_COMMUNITY): Payer: Medicare Other

## 2014-08-18 ENCOUNTER — Encounter (HOSPITAL_COMMUNITY)
Admission: RE | Admit: 2014-08-18 | Discharge: 2014-08-18 | Disposition: A | Discharge status: Home / Self Care | Payer: Medicare Other | Source: Ambulatory Visit | Attending: Interventional Cardiology | Admitting: Interventional Cardiology

## 2014-08-18 DIAGNOSIS — I213 ST elevation (STEMI) myocardial infarction of unspecified site: Secondary | ICD-10-CM | POA: Diagnosis not present

## 2014-08-20 ENCOUNTER — Encounter (HOSPITAL_COMMUNITY): Payer: Medicare Other

## 2014-08-22 ENCOUNTER — Encounter (HOSPITAL_COMMUNITY)
Admission: RE | Admit: 2014-08-22 | Discharge: 2014-08-22 | Disposition: A | Discharge status: Home / Self Care | Payer: Medicare Other | Source: Ambulatory Visit | Attending: Interventional Cardiology | Admitting: Interventional Cardiology

## 2014-08-22 DIAGNOSIS — I213 ST elevation (STEMI) myocardial infarction of unspecified site: Secondary | ICD-10-CM | POA: Diagnosis not present

## 2014-08-25 ENCOUNTER — Encounter (HOSPITAL_COMMUNITY)
Admission: RE | Admit: 2014-08-25 | Discharge: 2014-08-25 | Disposition: A | Discharge status: Home / Self Care | Payer: Medicare Other | Source: Ambulatory Visit | Attending: Interventional Cardiology | Admitting: Interventional Cardiology

## 2014-08-25 DIAGNOSIS — I213 ST elevation (STEMI) myocardial infarction of unspecified site: Secondary | ICD-10-CM | POA: Diagnosis not present

## 2014-08-27 ENCOUNTER — Encounter (HOSPITAL_COMMUNITY): Payer: Self-pay

## 2014-08-27 ENCOUNTER — Encounter (HOSPITAL_COMMUNITY)
Admission: RE | Admit: 2014-08-27 | Discharge: 2014-08-27 | Disposition: A | Discharge status: Home / Self Care | Payer: Medicare Other | Source: Ambulatory Visit | Attending: Interventional Cardiology | Admitting: Interventional Cardiology

## 2014-08-27 DIAGNOSIS — I213 ST elevation (STEMI) myocardial infarction of unspecified site: Secondary | ICD-10-CM | POA: Diagnosis not present

## 2014-08-27 NOTE — Progress Notes (Signed)
Pt graduated from cardiac rehab program today with completion of  34 exercise sessions in Phase II. Pt maintained good attendance and progressed nicely during his participation in rehab as evidenced by increased MET level.   Medication list reconciled. Repeat  PHQ score- 0 . Pt has successfully maintained smoking cessation with quit date of 8.4.2015.  Pt states this has not been difficult for him.  However pt reports he has substituted with increased dietary intake, which sometimes includes added sodium. Pt educated on low sodium diet.  Written information given.  Pt verbalized understanding.    Pt has made significant lifestyle changes and should be commended for his success. Pt feels he has achieved his goals during cardiac rehab.   Pt plans to continue exercising on his own with treadmill.

## 2014-09-03 ENCOUNTER — Encounter (HOSPITAL_COMMUNITY)
Admission: RE | Admit: 2014-09-03 | Discharge: 2014-09-03 | Disposition: A | Discharge status: Home / Self Care | Payer: Medicare Other | Source: Ambulatory Visit | Attending: Interventional Cardiology | Admitting: Interventional Cardiology

## 2014-09-03 DIAGNOSIS — I213 ST elevation (STEMI) myocardial infarction of unspecified site: Secondary | ICD-10-CM | POA: Diagnosis not present

## 2014-09-08 ENCOUNTER — Other Ambulatory Visit: Payer: Self-pay | Admitting: *Deleted

## 2014-09-08 MED ORDER — ATORVASTATIN CALCIUM 40 MG PO TABS: 40.0000 mg | ORAL_TABLET | Freq: Every day | ORAL | Status: DC

## 2014-09-18 ENCOUNTER — Encounter: Payer: Self-pay | Admitting: Interventional Cardiology

## 2014-10-09 ENCOUNTER — Telehealth: Payer: Self-pay | Admitting: *Deleted

## 2014-10-09 ENCOUNTER — Other Ambulatory Visit (INDEPENDENT_AMBULATORY_CARE_PROVIDER_SITE_OTHER): Payer: Medicare Other | Admitting: *Deleted

## 2014-10-09 DIAGNOSIS — Z79899 Other long term (current) drug therapy: Secondary | ICD-10-CM

## 2014-10-09 DIAGNOSIS — E785 Hyperlipidemia, unspecified: Secondary | ICD-10-CM

## 2014-10-09 DIAGNOSIS — I10 Essential (primary) hypertension: Secondary | ICD-10-CM

## 2014-10-09 LAB — LIPID PANEL
Cholesterol: 99 mg/dL (ref 0–200)
HDL: 28.9 mg/dL — ABNORMAL LOW (ref >39.00)
LDL Cholesterol: 56 mg/dL (ref 0–99)
NONHDL: 70.1
Total CHOL/HDL Ratio: 3
Triglycerides: 71 mg/dL (ref 0.0–149.0)
VLDL: 14.2 mg/dL (ref 0.0–40.0)

## 2014-10-09 LAB — HEPATIC FUNCTION PANEL
ALT: 19 U/L (ref 0–53)
AST: 24 U/L (ref 0–37)
Albumin: 3.6 g/dL (ref 3.5–5.2)
Alkaline Phosphatase: 62 U/L (ref 39–117)
Bilirubin, Direct: 0.1 mg/dL (ref 0.0–0.3)
TOTAL PROTEIN: 6.7 g/dL (ref 6.0–8.3)
Total Bilirubin: 0.6 mg/dL (ref 0.2–1.2)

## 2014-10-09 NOTE — Telephone Encounter (Signed)
Made pt aware of lab results and to recheck labs in one year. Pt verbalized understanding and was in agreement with this plan.

## 2014-10-09 NOTE — Telephone Encounter (Signed)
-----   Message from Jettie Booze, MD sent at 10/09/2014  4:15 PM EST ----- Lipids and liver well controlled.  Recheck in one year.

## 2014-10-21 ENCOUNTER — Other Ambulatory Visit: Payer: Self-pay | Admitting: Cardiology

## 2014-12-07 ENCOUNTER — Other Ambulatory Visit: Payer: Self-pay | Admitting: Interventional Cardiology

## 2015-01-14 ENCOUNTER — Ambulatory Visit (INDEPENDENT_AMBULATORY_CARE_PROVIDER_SITE_OTHER): Payer: Medicare Other | Admitting: Interventional Cardiology

## 2015-01-14 ENCOUNTER — Encounter: Payer: Self-pay | Admitting: Interventional Cardiology

## 2015-01-14 VITALS — BP 134/74 | HR 57 | Ht 71.0 in | Wt 219.8 lb

## 2015-01-14 DIAGNOSIS — I2119 ST elevation (STEMI) myocardial infarction involving other coronary artery of inferior wall: Secondary | ICD-10-CM

## 2015-01-14 DIAGNOSIS — I2510 Atherosclerotic heart disease of native coronary artery without angina pectoris: Secondary | ICD-10-CM | POA: Diagnosis not present

## 2015-01-14 DIAGNOSIS — I10 Essential (primary) hypertension: Secondary | ICD-10-CM

## 2015-01-14 NOTE — Progress Notes (Signed)
Patient ID: Angel West, male   DOB: [birth year], 74 y.o.   MRN: [id number]     Cardiology Office Note   Date:  01/14/2015   ID:  Angel West, DOB 1940-10-22, MRN [id number]  PCP:  [name], MD    No chief complaint on file.    Wt Readings from Last 3 Encounters:  01/14/15 219 lb 12.8 oz (99.701 kg)  07/02/14 213 lb 6.4 oz (96.798 kg)  07/01/14 205 lb (92.987 kg)       History of Present Illness: Angel West is a 74 y.o. male  with a history of remote RCA PTCA (Dr Karolee Ohs was his doctor then). He had no significant cardiac issues since. He presented 04/08/14 with an inferior STEMI. He received a BMS to his RCA. He had NL LVF and mild residual disease.  Since then, he had an esophageal stretching.  NO significant bleeding problems.  No sx like what he had with the MI. Rare nose bleeds on the right side.   Not walking regularly.  No CP or SHOB when he does walk. He continues to work as an Optometrist.    Past Medical History  Diagnosis Date  . Dyslipidemia     low HDL  . Hypertension   . Cancer prostate  . Dysphagia 2012    esophageal diliation  . Tobacco use 04/10/2014  . S/P angioplasty with stent mRCA with BMS- vision 04/08/2014  . CAD S/P percutaneous coronary angioplasty     hx of prior remote PCI  . STEMI (ST elevation myocardial infarction) 04/08/14  . Prostate cancer 2005    surg   . COPD (chronic obstructive pulmonary disease)     Past Surgical History  Procedure Laterality Date  . Colonoscopy  2007  . Esophagogastroduodenoscopy  08/25/2011    Procedure: ESOPHAGOGASTRODUODENOSCOPY (EGD);  Surgeon: Garlan Fair, MD;  Location: [facility];  Service: Endoscopy;  Laterality: N/A;  . Balloon dilation  08/25/2011    Procedure: BALLOON DILATION;  Surgeon: Garlan Fair, MD;  Location: [facility];  Service: Endoscopy;  Laterality: N/A;  . Coronary stent placement  04/08/14    RCA BMS  . Prostatectomy  12/15/2003  .  Esophagogastroduodenoscopy N/A 07/01/2014    Procedure: ESOPHAGOGASTRODUODENOSCOPY (EGD);  Surgeon: Garlan Fair, MD;  Location: [facility];  Service: Endoscopy;  Laterality: N/A;  . Balloon dilation N/A 07/01/2014    Procedure: BALLOON DILATION;  Surgeon: Garlan Fair, MD;  Location: [facility];  Service: Endoscopy;  Laterality: N/A;  . Left heart catheterization with coronary angiogram N/A 04/08/2014    Procedure: LEFT HEART CATHETERIZATION WITH CORONARY ANGIOGRAM;  Surgeon: Jettie Booze, MD;  Location: [facility] CATH LAB;  Service: Cardiovascular;  Laterality: N/A;     Current Outpatient Prescriptions  Medication Sig Dispense Refill  . aspirin EC 81 MG EC tablet Take 1 tablet (81 mg total) by mouth daily.    Marland Kitchen atorvastatin (LIPITOR) 40 MG tablet TAKE ONE TABLET BY MOUTH ONE TIME DAILY  90 tablet 1  . carvedilol (COREG) 3.125 MG tablet TAKE ONE TABLET BY MOUTH TWICE A DAY WITH MEALS  60 tablet 5  . cholecalciferol (VITAMIN D) 1000 UNITS tablet Take 1,000 Units by mouth daily.      Marland Kitchen MAGNESIUM PO Take 1 tablet by mouth daily with supper.    . nitroGLYCERIN (NITROSTAT) 0.4 MG SL tablet Place 1 tablet (0.4 mg total) under the tongue every 5 (five) minutes x 3 doses as needed for chest  pain. 25 tablet 4  . NON FORMULARY Take 1 tablet by mouth daily with supper. Formula one    . prednisoLONE acetate (PRED FORTE) 1 % ophthalmic suspension Place 1 drop into the left eye daily.      No current facility-administered medications for this visit.    Allergies:   Review of patient's allergies indicates no known allergies.    Social History:  The patient  reports that he quit smoking about 9 months ago. His smoking use included Cigars. He does not have any smokeless tobacco history on file. He reports that he does not drink alcohol or use illicit drugs.   Family History:  The patient's family history includes Stroke in his mother. There is no history of Anesthesia problems or Malignant  hyperthermia.    ROS:  Please see the history of present illness.   Otherwise, review of systems are positive for lack of exercise and some poor choices eating.   All other systems are reviewed and negative.    PHYSICAL EXAM: VS:  BP 134/74 mmHg  Pulse 57  Ht 5\' 11"  (1.803 m)  Wt 219 lb 12.8 oz (99.701 kg)  BMI 30.67 kg/m2  SpO2 99% , BMI Body mass index is 30.67 kg/(m^2). GEN: Well nourished, well developed, in no acute distress HEENT: normal Neck: no JVD, carotid bruits, or masses Cardiac: RRR; no murmurs, rubs, or gallops,no edema  Respiratory:  clear to auscultation bilaterally, normal work of breathing GI: soft, nontender, nondistended, + BS MS: no deformity or atrophy Skin: warm and dry, no rash Neuro:  Strength and sensation are intact Psych: euthymic mood, full affect    Recent Labs: 04/08/2014: Pro B Natriuretic peptide (BNP) 140.6 04/09/2014: BUN 13; Creatinine 1.06; Hemoglobin 13.2; Platelets 121*; Potassium 4.2; Sodium 143 10/09/2014: ALT 19   Lipid Panel    Component Value Date/Time   CHOL 99 10/09/2014 0812   TRIG 71.0 10/09/2014 0812   HDL 28.90* 10/09/2014 0812   CHOLHDL 3 10/09/2014 0812   VLDL 14.2 10/09/2014 0812   LDLCALC 56 10/09/2014 0812     Other studies Reviewed: Additional studies/ records that were reviewed today with results demonstrating: Bare metal stent to RCA in 8/15..   ASSESSMENT AND PLAN:  Inferior Mi in 8/15: BMS placed due to planned procedures. Off P2Y12 inhibitor. COntinue aspirin. Continue regular exercise. Watch for any sx like his prior MI. None so far.    Hyperlipidemia: LDL 56 in 2/16. COntinue atorvastatin.   Offered a referral to cardiac rehab but he will hold off at this time.    Current medicines are reviewed at length with the patient today.  The patient concerns regarding his medicines were addressed.  The following changes have been made:  No change  Labs/ tests ordered today include:  No orders of the  defined types were placed in this encounter.    Recommend 150 minutes/week of aerobic exercise Low fat, low carb, high fiber diet recommended- his diet is not healthy at this time per his admission  Disposition:   FU in 1 year   Teresita Madura., MD  01/14/2015 3:40 PM    [facility] [address], [place], [place]  [address] Phone: [phone]; Fax: [phone]

## 2015-01-14 NOTE — Patient Instructions (Signed)
Medication Instructions:  Your physician recommends that you continue on your current medications as directed. Please refer to the Current Medication list given to you today.   Labwork: NONE  Testing/Procedures: NONE  Follow-Up: Your physician wants you to follow-up in: 12 months with Dr. Irish Lack. You will receive a reminder letter in the mail two months in advance. If you don't receive a letter, please call our office to schedule the follow-up appointment.   Any Other Special Instructions Will Be Listed Below (If Applicable).

## 2015-03-20 IMAGING — CR DG CHEST 1V PORT
1 series · 1 of 1 positions shown · non-contrast
Comparison: Chest radiograph July 08, 2011

CLINICAL DATA: Shortness of breath.  Chest pain.

EXAM:
PORTABLE CHEST - 1 VIEW

[AP]
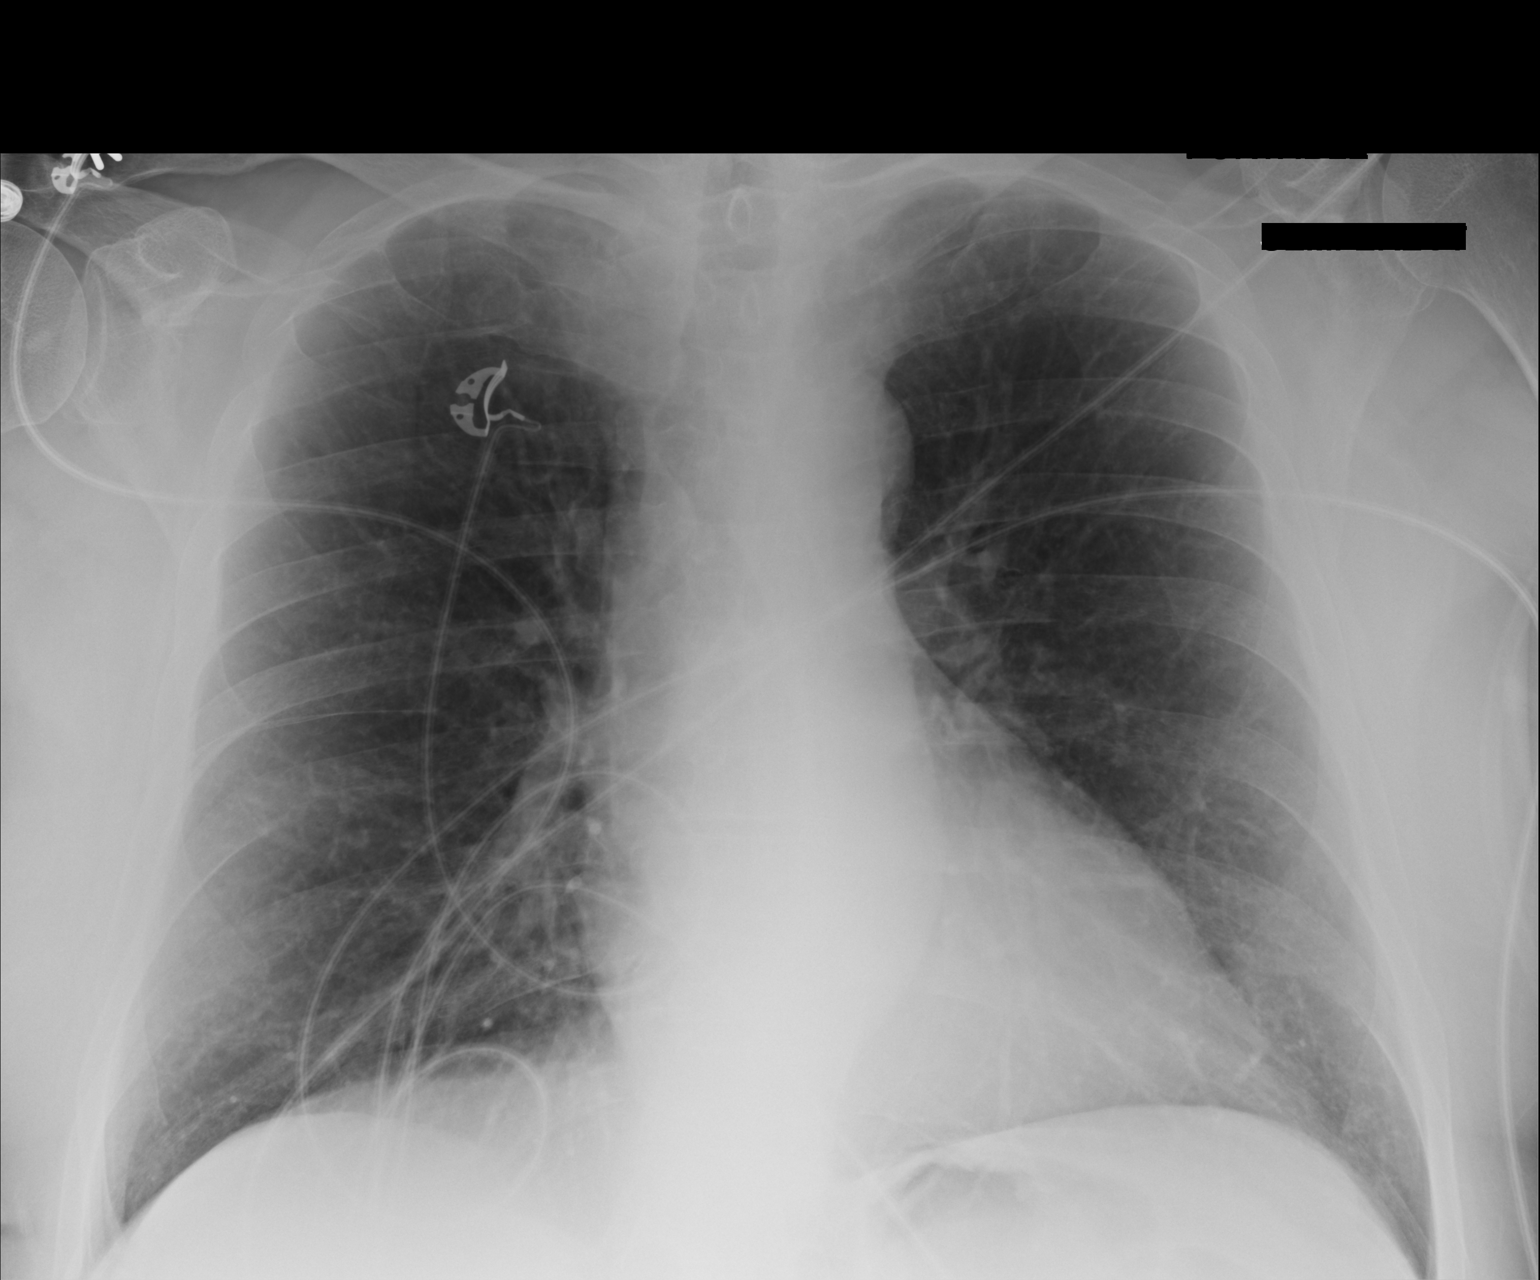

[1 of 1 positions shown; findings below may reference images not displayed]

FINDINGS: Cardiomediastinal silhouette is normal, mildly tortuous aorta. Mild
chronic interstitial changes. The lungs are otherwise clear without
pleural effusions or focal consolidations. Trachea projects midline
and there is no pneumothorax. Soft tissue planes and included
osseous structures are non-suspicious.
IMPRESSION: Probable COPD without superimposed acute cardiopulmonary process.

  By: Volkan Can Renas

## 2015-05-06 ENCOUNTER — Other Ambulatory Visit: Payer: Self-pay | Admitting: Interventional Cardiology

## 2015-06-07 ENCOUNTER — Other Ambulatory Visit: Payer: Self-pay | Admitting: Interventional Cardiology

## 2015-07-06 ENCOUNTER — Other Ambulatory Visit: Payer: Self-pay

## 2015-07-06 MED ORDER — CARVEDILOL 3.125 MG PO TABS: 3.1250 mg | ORAL_TABLET | Freq: Two times a day (BID) | ORAL | Status: DC

## 2015-08-26 ENCOUNTER — Other Ambulatory Visit: Payer: Self-pay | Admitting: Interventional Cardiology

## 2015-12-07 ENCOUNTER — Other Ambulatory Visit: Payer: Self-pay | Admitting: *Deleted

## 2015-12-07 MED ORDER — ATORVASTATIN CALCIUM 40 MG PO TABS: 40.0000 mg | ORAL_TABLET | Freq: Every day | ORAL | Status: DC

## 2016-01-19 ENCOUNTER — Ambulatory Visit (INDEPENDENT_AMBULATORY_CARE_PROVIDER_SITE_OTHER): Payer: Medicare Other | Admitting: Interventional Cardiology

## 2016-01-19 ENCOUNTER — Encounter: Payer: Self-pay | Admitting: Interventional Cardiology

## 2016-01-19 VITALS — BP 130/80 | HR 80 | Ht 71.0 in | Wt 214.0 lb

## 2016-01-19 DIAGNOSIS — I10 Essential (primary) hypertension: Secondary | ICD-10-CM | POA: Diagnosis not present

## 2016-01-19 DIAGNOSIS — E785 Hyperlipidemia, unspecified: Secondary | ICD-10-CM | POA: Diagnosis not present

## 2016-01-19 DIAGNOSIS — Z959 Presence of cardiac and vascular implant and graft, unspecified: Secondary | ICD-10-CM | POA: Diagnosis not present

## 2016-01-19 DIAGNOSIS — Z95820 Peripheral vascular angioplasty status with implants and grafts: Secondary | ICD-10-CM

## 2016-01-19 NOTE — Patient Instructions (Signed)
**Note De-identified Angel West Obfuscation** Medication Instructions:  Same-no changes  Labwork: None  Testing/Procedures: None  Follow-Up: Your physician wants you to follow-up in: 1 year. You will receive a reminder letter in the mail two months in advance. If you don't receive a letter, please call our office to schedule the follow-up appointment.      If you need a refill on your cardiac medications before your next appointment, please call your pharmacy.   

## 2016-01-19 NOTE — Progress Notes (Signed)
Patient ID: Angel West, male   DOB: [birth year], 75 y.o.   MRN: [id number]     Cardiology Office Note   Date:  01/19/2016   ID:  Angel West, DOB 02-19-1941, MRN [id number]  PCP:  [name], MD    No chief complaint on file.  F/u CAD, MI  Wt Readings from Last 3 Encounters:  01/19/16 214 lb (97.07 kg)  01/14/15 219 lb 12.8 oz (99.701 kg)  07/02/14 213 lb 6.4 oz (96.798 kg)       History of Present Illness: Angel West is a 75 y.o. male  with a history of remote RCA PTCA (Dr Karolee Ohs was his doctor then). He had no significant cardiac issues since. He presented 04/08/14 with an inferior STEMI. He received a BMS to his RCA. He had NL LVF and mild residual disease.   Since his last visit, he has done well.  HE walks  Although this has decreased in the last few weeks.  He has ben busy with work, Press photographer.  When he walks, he has not had any chest discomfort.  He works from his home.    No symptoms like his MI in 2015. He has not used NTG.  No SHOB, dizziness, syncope.      Past Medical History  Diagnosis Date  . Dyslipidemia     low HDL  . Hypertension   . Cancer [facility]) prostate  . Dysphagia 2012    esophageal diliation  . Tobacco use 04/10/2014  . S/P angioplasty with stent mRCA with BMS- vision 04/08/2014  . CAD S/P percutaneous coronary angioplasty     hx of prior remote PCI  . STEMI (ST elevation myocardial infarction) ([place]) 04/08/14  . Prostate cancer ([place]) 2005    surg   . COPD (chronic obstructive pulmonary disease) [facility])     Past Surgical History  Procedure Laterality Date  . Colonoscopy  2007  . Esophagogastroduodenoscopy  08/25/2011    Procedure: ESOPHAGOGASTRODUODENOSCOPY (EGD);  Surgeon: Garlan Fair, MD;  Location: [facility];  Service: Endoscopy;  Laterality: N/A;  . Balloon dilation  08/25/2011    Procedure: BALLOON DILATION;  Surgeon: Garlan Fair, MD;  Location: [facility];  Service: Endoscopy;  Laterality: N/A;    . Coronary stent placement  04/08/14    RCA BMS  . Prostatectomy  12/15/2003  . Esophagogastroduodenoscopy N/A 07/01/2014    Procedure: ESOPHAGOGASTRODUODENOSCOPY (EGD);  Surgeon: Garlan Fair, MD;  Location: [facility];  Service: Endoscopy;  Laterality: N/A;  . Balloon dilation N/A 07/01/2014    Procedure: BALLOON DILATION;  Surgeon: Garlan Fair, MD;  Location: [facility];  Service: Endoscopy;  Laterality: N/A;  . Left heart catheterization with coronary angiogram N/A 04/08/2014    Procedure: LEFT HEART CATHETERIZATION WITH CORONARY ANGIOGRAM;  Surgeon: Jettie Booze, MD;  Location: [facility] CATH LAB;  Service: Cardiovascular;  Laterality: N/A;     Current Outpatient Prescriptions  Medication Sig Dispense Refill  . aspirin EC 81 MG EC tablet Take 1 tablet (81 mg total) by mouth daily.    Marland Kitchen atorvastatin (LIPITOR) 40 MG tablet Take 1 tablet (40 mg total) by mouth daily. 90 tablet 3  . carvedilol (COREG) 3.125 MG tablet Take 1 tablet (3.125 mg total) by mouth 2 (two) times daily with a meal. 180 tablet 1  . cholecalciferol (VITAMIN D) 1000 UNITS tablet Take 1,000 Units by mouth daily.      Marland Kitchen MAGNESIUM PO Take 1 tablet by mouth daily with supper.    Marland Kitchen  nitroGLYCERIN (NITROSTAT) 0.4 MG SL tablet Place 1 tablet (0.4 mg total) under the tongue every 5 (five) minutes x 3 doses as needed for chest pain. 25 tablet 4  . NON FORMULARY Take 1 tablet by mouth daily with supper. Formula one     No current facility-administered medications for this visit.    Allergies:   Review of patient's allergies indicates no known allergies.    Social History:  The patient  reports that he quit smoking about 21 months ago. His smoking use included Cigars. He does not have any smokeless tobacco history on file. He reports that he does not drink alcohol or use illicit drugs.   Family History:  The patient's family history includes Stroke in his mother. There is no history of Anesthesia problems, Malignant  hyperthermia, Heart attack, or Hypertension.    ROS:  Please see the history of present illness.   Otherwise, review of systems are positive for no bleeding problems; no joint pains.   All other systems are reviewed and negative.    PHYSICAL EXAM: VS:  BP 130/80 mmHg  Pulse 80  Ht 5\' 11"  (1.803 m)  Wt 214 lb (97.07 kg)  BMI 29.86 kg/m2 , BMI Body mass index is 29.86 kg/(m^2). GEN: Well nourished, well developed, in no acute distress HEENT: normal Neck: no JVD, carotid bruits, or masses Cardiac: RRR; no murmurs, rubs, or gallops,no edema  Respiratory:  clear to auscultation bilaterally, normal work of breathing GI: soft, nontender, nondistended, + BS MS: no deformity or atrophy Skin: warm and dry, no rash Neuro:  Strength and sensation are intact Psych: euthymic mood, full affect   EKG:   The ekg ordered today demonstrates NSR, LAD, no ST segment changes   Recent Labs: No results found for requested labs within last 365 days.   Lipid Panel    Component Value Date/Time   CHOL 99 10/09/2014 0812   TRIG 71.0 10/09/2014 0812   HDL 28.90* 10/09/2014 0812   CHOLHDL 3 10/09/2014 0812   VLDL 14.2 10/09/2014 0812   LDLCALC 56 10/09/2014 0812     Other studies Reviewed: Additional studies/ records that were reviewed today with results demonstrating: labs from PMD reviewed.   ASSESSMENT AND PLAN:  1. CAD/Old MI: Inferior MI in 8/15: BMS placed due to planned procedures. Off P2Y12 inhibitor. COntinue aspirin. Continue regular exercise. Watch for any sx like his prior MI. Continue aggressive secondary prevention. 2. HTN: Well controlled.  COntinue carvedilol.  3. Hyperlipidemia: Lipids controlled on atorvastatin. LDL 70 in 3/17.    Current medicines are reviewed at length with the patient today.  The patient concerns regarding his medicines were addressed.  The following changes have been made:  No change  Labs/ tests ordered today include:   Orders Placed This  Encounter  Procedures  . EKG 12-Lead    Recommend 150 minutes/week of aerobic exercise Low fat, low carb, high fiber diet recommended  Disposition:   FU in 1 year   Signed, Larae Grooms, MD  01/19/2016 3:17 PM    [facility] [address], [place], [place]  [address] Phone: [phone]; Fax: [phone]

## 2016-08-28 ENCOUNTER — Other Ambulatory Visit: Payer: Self-pay | Admitting: Interventional Cardiology

## 2016-09-28 ENCOUNTER — Encounter: Payer: Self-pay | Admitting: Interventional Cardiology

## 2016-11-08 ENCOUNTER — Telehealth: Payer: Self-pay | Admitting: Interventional Cardiology

## 2016-11-08 NOTE — Telephone Encounter (Signed)
New message    Pt is calling to ask if it would be ok to do cryogenics therapy with a stent? He is thinking of doing this and wants to make sure it would be ok.

## 2016-11-08 NOTE — Telephone Encounter (Signed)
What does he have to do with this therapy?

## 2016-11-09 NOTE — Telephone Encounter (Signed)
The patient states that he would like to have this done at [place]. The patient would be placed in a chamber with the head and arms out and the patient's core temperature would be dropped 30-40 degrees Farenheit for 3 minutes.

## 2016-11-14 NOTE — Telephone Encounter (Signed)
OK to have treatment.

## 2016-11-14 NOTE — Telephone Encounter (Signed)
Informed pt ok to have tx.  Pt appreciative for call.

## 2016-12-06 ENCOUNTER — Other Ambulatory Visit: Payer: Self-pay | Admitting: Interventional Cardiology

## 2017-01-03 ENCOUNTER — Encounter: Payer: Self-pay | Admitting: Interventional Cardiology

## 2017-01-18 NOTE — Progress Notes (Signed)
Patient ID: Angel West, male   DOB: [birth year], 76 y.o.   MRN: [id number]     Cardiology Office Note   Date:  01/19/2017   ID:  GARLAND HINCAPIE, DOB [birth year], MRN [id number]  PCP:  [name], MD    Chief Complaint  Patient presents with  . Follow-up   F/u CAD, MI  Wt Readings from Last 3 Encounters:  01/19/17 225 lb 6.4 oz (102.2 kg)  01/19/16 214 lb (97.1 kg)  01/14/15 219 lb 12.8 oz (99.7 kg)       History of Present Illness: Angel West is a 76 y.o. male  with a history of remote RCA PTCA (Dr Karolee Ohs was his doctor then). He had no significant cardiac issues since. He presented 04/08/14 with an inferior STEMI. He received a BMS to his RCA. He had NL LVF and mild residual disease.   Since his last visit, he has done well.  He walks very little due to work.  He does computer work.   He has been busy with work, Press photographer.  When he walks, he has not had any chest discomfort.  He works from his home.      No symptoms like his MI in 2015. He has not used NTG.  No SHOB, dizziness, syncope.    He states he is lazy and that is partly why he does not walk.  He eats a fair amount of sweets.  2015 cath showed: 1. Patent left main coronary artery. 2. Mild disease in the left anterior descending artery and its branches. 3. Mild disease in the left circumflex artery and its branches. 4. Occluded mid right coronary artery.  This was the culprit for today's presentation. This was successfully treated with a 3.0 x 18 vision bare-metal stent, postdilated to 3.6 mm in diameter. A bare-metal stent was chosen because the patient has several invasive procedures including esophageal stretching later this month. 5. Normal left ventricular systolic function.  LVEDP 10 mmHg.  Ejection fraction 55 %.    Past Medical History:  Diagnosis Date  . CAD S/P percutaneous coronary angioplasty    hx of prior remote PCI  . Cancer [facility]) prostate  . COPD (chronic obstructive  pulmonary disease) ([place])   . Dyslipidemia    low HDL  . Dysphagia 2012   esophageal diliation  . Hypertension   . Prostate cancer ([place]) 2005   surg   . S/P angioplasty with stent mRCA with BMS- vision 04/08/2014  . STEMI (ST elevation myocardial infarction) ([place]) 04/08/14  . Tobacco use 04/10/2014    Past Surgical History:  Procedure Laterality Date  . BALLOON DILATION  08/25/2011   Procedure: BALLOON DILATION;  Surgeon: Garlan Fair, MD;  Location: [facility];  Service: Endoscopy;  Laterality: N/A;  . BALLOON DILATION N/A 07/01/2014   Procedure: BALLOON DILATION;  Surgeon: Garlan Fair, MD;  Location: [facility];  Service: Endoscopy;  Laterality: N/A;  . COLONOSCOPY  2007  . CORONARY STENT PLACEMENT  04/08/14   RCA BMS  . ESOPHAGOGASTRODUODENOSCOPY  08/25/2011   Procedure: ESOPHAGOGASTRODUODENOSCOPY (EGD);  Surgeon: Garlan Fair, MD;  Location: [facility];  Service: Endoscopy;  Laterality: N/A;  . ESOPHAGOGASTRODUODENOSCOPY N/A 07/01/2014   Procedure: ESOPHAGOGASTRODUODENOSCOPY (EGD);  Surgeon: Garlan Fair, MD;  Location: [facility];  Service: Endoscopy;  Laterality: N/A;  . LEFT HEART CATHETERIZATION WITH CORONARY ANGIOGRAM N/A 04/08/2014   Procedure: LEFT HEART CATHETERIZATION WITH CORONARY ANGIOGRAM;  Surgeon: Jettie Booze, MD;  Location:  [facility];  Service: Cardiovascular;  Laterality: N/A;  . PROSTATECTOMY  12/15/2003     Current Outpatient Prescriptions  Medication Sig Dispense Refill  . aspirin 81 MG tablet Take 81 mg by mouth every other day.    Marland Kitchen atorvastatin (LIPITOR) 40 MG tablet TAKE 1 TABLET (40 MG TOTAL) BY MOUTH DAILY. 90 tablet 3  . carvedilol (COREG) 3.125 MG tablet TAKE 1 TABLET BY MOUTH TWICE DAILY WITH A MEAL 180 tablet 3  . cholecalciferol (VITAMIN D) 1000 UNITS tablet Take 1,000 Units by mouth daily.      Marland Kitchen MAGNESIUM PO Take 1 tablet by mouth daily with supper.    . nitroGLYCERIN (NITROSTAT) 0.4 MG SL tablet Place 1 tablet (0.4 mg  total) under the tongue every 5 (five) minutes x 3 doses as needed for chest pain. 25 tablet 4  . NON FORMULARY Take 1 tablet by mouth daily with supper. Formula one     No current facility-administered medications for this visit.     Allergies:   Patient has no known allergies.    Social History:  The patient  reports that he quit smoking about 2 years ago. His smoking use included Cigars. He smoked 1.00 pack per day. He has never used smokeless tobacco. He reports that he does not drink alcohol or use drugs.   Family History:  The patient's family history includes Stroke in his mother.    ROS:  Please see the history of present illness.   Otherwise, review of systems are positive for no bleeding problems; right ankle swelling, weight gain.   All other systems are reviewed and negative.    PHYSICAL EXAM: VS:  BP 122/82   Pulse 67   Ht 5' 9.5" (1.765 m)   Wt 225 lb 6.4 oz (102.2 kg)   BMI 32.81 kg/m  , BMI Body mass index is 32.81 kg/m. GEN: Well nourished, well developed, in no acute distress  HEENT: normal  Neck: no JVD, carotid bruits, or masses Cardiac: RRR; no murmurs, rubs, or gallops,; mild right ankle edema ; diffuse prominence of the veins in the legs Respiratory:  clear to auscultation bilaterally, normal work of breathing GI: soft, nontender, nondistended, + BS MS: no deformity or atrophy  Skin: warm and dry, no rash Neuro:  Strength and sensation are intact Psych: euthymic mood, full affect   EKG:   The ekg ordered today demonstrates NSR, LAD, non specfic ST segment changes- no significant change from 2017   Recent Labs: No results found for requested labs within last 8760 hours.   Lipid Panel    Component Value Date/Time   CHOL 99 10/09/2014 0812   TRIG 71.0 10/09/2014 0812   HDL 28.90 (L) 10/09/2014 0812   CHOLHDL 3 10/09/2014 0812   VLDL 14.2 10/09/2014 0812   LDLCALC 56 10/09/2014 0812     Other studies Reviewed: Additional studies/ records that  were reviewed today with results demonstrating: cath results as above   ASSESSMENT AND PLAN:  1. 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 No angina on medical therapy.Off P2Y12 inhibitor. COntinue aspirin.  He decreased it to QOD due to nose bleeds. Start regular exercise- walking. Watch for any sx like his prior MI. Continue aggressive secondary prevention. 2. HTN: Well controlled.  COntinue carvedilol. Weight has increased.  He needs healthy lifestyle changes as well.  Spoke about trying to lose weight.   3. Hyperlipidemia: Lipids controlled on atorvastatin. LDL 70 in  3/17. Followed by Dr. Abbott Pao. 4. Ankle edema: Likely from venous insufficiency and old injury. 5. His wife is healthy, but he does not eat like she does.     Current medicines are reviewed at length with the patient today.  The patient concerns regarding his medicines were addressed.  The following changes have been made:  No change  Labs/ tests ordered today include:   No orders of the defined types were placed in this encounter.   Recommend 150 minutes/week of aerobic exercise Low fat, low carb, high fiber diet recommended  Disposition:   FU in 1 year   Signed, Larae Grooms, MD  01/19/2017 9:11 AM    [facility] [place], [place], [place]  [address] Phone: [phone]; Fax: [phone]

## 2017-01-19 ENCOUNTER — Encounter: Payer: Self-pay | Admitting: Interventional Cardiology

## 2017-01-19 ENCOUNTER — Ambulatory Visit (INDEPENDENT_AMBULATORY_CARE_PROVIDER_SITE_OTHER): Payer: Medicare Other | Admitting: Interventional Cardiology

## 2017-01-19 VITALS — BP 122/82 | HR 67 | Ht 69.5 in | Wt 225.4 lb

## 2017-01-19 DIAGNOSIS — I25119 Atherosclerotic heart disease of native coronary artery with unspecified angina pectoris: Secondary | ICD-10-CM | POA: Diagnosis not present

## 2017-01-19 DIAGNOSIS — I2119 ST elevation (STEMI) myocardial infarction involving other coronary artery of inferior wall: Secondary | ICD-10-CM | POA: Diagnosis not present

## 2017-01-19 DIAGNOSIS — Z95820 Peripheral vascular angioplasty status with implants and grafts: Secondary | ICD-10-CM

## 2017-01-19 DIAGNOSIS — Z959 Presence of cardiac and vascular implant and graft, unspecified: Secondary | ICD-10-CM | POA: Diagnosis not present

## 2017-01-19 DIAGNOSIS — I10 Essential (primary) hypertension: Secondary | ICD-10-CM

## 2017-01-19 DIAGNOSIS — E782 Mixed hyperlipidemia: Secondary | ICD-10-CM

## 2017-01-19 MED ORDER — NITROGLYCERIN 0.4 MG SL SUBL: 0.4000 mg | SUBLINGUAL_TABLET | SUBLINGUAL | 4 refills | Status: DC | PRN

## 2017-01-19 NOTE — Patient Instructions (Signed)

## 2017-11-28 ENCOUNTER — Other Ambulatory Visit: Payer: Self-pay | Admitting: Interventional Cardiology

## 2018-01-25 ENCOUNTER — Other Ambulatory Visit: Payer: Self-pay | Admitting: Interventional Cardiology

## 2018-02-21 ENCOUNTER — Other Ambulatory Visit: Payer: Self-pay | Admitting: Interventional Cardiology

## 2018-03-01 ENCOUNTER — Ambulatory Visit (INDEPENDENT_AMBULATORY_CARE_PROVIDER_SITE_OTHER): Payer: Medicare Other | Admitting: Interventional Cardiology

## 2018-03-01 ENCOUNTER — Encounter: Payer: Self-pay | Admitting: Interventional Cardiology

## 2018-03-01 VITALS — BP 110/86 | HR 72 | Ht 69.5 in | Wt 209.0 lb

## 2018-03-01 DIAGNOSIS — I25119 Atherosclerotic heart disease of native coronary artery with unspecified angina pectoris: Secondary | ICD-10-CM | POA: Diagnosis not present

## 2018-03-01 DIAGNOSIS — R600 Localized edema: Secondary | ICD-10-CM

## 2018-03-01 DIAGNOSIS — I252 Old myocardial infarction: Secondary | ICD-10-CM | POA: Diagnosis not present

## 2018-03-01 DIAGNOSIS — I10 Essential (primary) hypertension: Secondary | ICD-10-CM | POA: Diagnosis not present

## 2018-03-01 DIAGNOSIS — E782 Mixed hyperlipidemia: Secondary | ICD-10-CM | POA: Diagnosis not present

## 2018-03-01 NOTE — Patient Instructions (Signed)

## 2018-03-01 NOTE — Progress Notes (Signed)
Cardiology Office Note   Date:  03/01/2018   ID:  Angel West, DOB 08-Sep-1940, MRN [id number]  PCP:  [name], MD    No chief complaint on file.  CAD/Old MI  Wt Readings from Last 3 Encounters:  03/01/18 209 lb (94.8 kg)  01/19/17 225 lb 6.4 oz (102.2 kg)  01/19/16 214 lb (97.1 kg)       History of Present Illness: Angel West is a 77 y.o. male  with a history of remote RCA PTCA (Dr Karolee Ohs was his doctor then). He had no significant cardiac issues since. He presented 04/08/14 with an inferior STEMI. He received a BMS to his RCA. He had NL LVF and mild residual disease.   He walks very little due to work.  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   2015 cath showed: 1. Patent left main coronary artery. 2. Mild disease in the left anterior descending artery and its branches. 3. Mild disease in the left circumflex artery and its branches. 4. Occluded mid right coronary artery. This was the culprit for today's presentation. This was successfully treated with a 3.0 x 18 vision bare-metal stent, postdilated to 3.6 mm in diameter. A bare-metal stent was chosen because the patient has several invasive procedures including esophageal stretching later this month. 5. Normal left ventricular systolic function. LVEDP 10 mmHg. Ejection fraction 55 %.  He has had nosebleeds in the past.  No recent bleeding problems.  Denies : Chest pain. Dizziness. Leg edema. Nitroglycerin use. Orthopnea. Palpitations. Paroxysmal nocturnal dyspnea. Shortness of breath. Syncope.   He did have gout; now on allopurinol.  Lost weight with Nutrisystem, about 20 lbs.  Past Medical History:  Diagnosis Date  . CAD S/P percutaneous coronary angioplasty    hx of prior remote PCI  . Cancer [facility]) prostate  . COPD (chronic obstructive pulmonary disease) ([place])   . Dyslipidemia    low HDL  . Dysphagia 2012   esophageal diliation  . Hypertension   . Prostate cancer  ([place]) 2005   surg   . S/P angioplasty with stent mRCA with BMS- vision 04/08/2014  . STEMI (ST elevation myocardial infarction) ([place]) 04/08/14  . Tobacco use 04/10/2014    Past Surgical History:  Procedure Laterality Date  . BALLOON DILATION  08/25/2011   Procedure: BALLOON DILATION;  Surgeon: Garlan Fair, MD;  Location: [facility];  Service: Endoscopy;  Laterality: N/A;  . BALLOON DILATION N/A 07/01/2014   Procedure: BALLOON DILATION;  Surgeon: Garlan Fair, MD;  [facility];  Service: Endoscopy;  Laterality: N/A;  . COLONOSCOPY  2007  . CORONARY STENT PLACEMENT  04/08/14   RCA BMS  . ESOPHAGOGASTRODUODENOSCOPY  08/25/2011   Procedure: ESOPHAGOGASTRODUODENOSCOPY (EGD);  Surgeon: Garlan Fair, MD;  Location: [facility];  Service: Endoscopy;  Laterality: N/A;  . ESOPHAGOGASTRODUODENOSCOPY N/A 07/01/2014   Procedure: ESOPHAGOGASTRODUODENOSCOPY (EGD);  Surgeon: Garlan Fair, MD;  Location: [facility];  Service: Endoscopy;  Laterality: N/A;  . LEFT HEART CATHETERIZATION WITH CORONARY ANGIOGRAM N/A 04/08/2014   Procedure: LEFT HEART CATHETERIZATION WITH CORONARY ANGIOGRAM;  Surgeon: Jettie Booze, MD;  Location: [facility] CATH LAB;  Service: Cardiovascular;  Laterality: N/A;  . PROSTATECTOMY  12/15/2003       Allergies:   Patient has no known allergies.    Social History:  The patient  reports that he quit smoking about 3 years ago. His smoking use included cigars. He smoked 1.00 pack per day. He  has never used smokeless tobacco. He reports that he does not drink alcohol or use drugs.   Family History:  The patient's family history includes Stroke in his mother.    ROS:  Please see the history of present illness.   Otherwise, review of systems are positive for easy bruising.   All other systems are reviewed and negative.    PHYSICAL EXAM: VS:  BP 110/86   Pulse 72   Ht 5' 9.5" (1.765 m)   Wt 209 lb (94.8 kg)   SpO2 96%   BMI 30.42 kg/m  , BMI Body mass  index is 30.42 kg/m. GEN: Well nourished, well developed, in no acute distress  HEENT: normal  Neck: no JVD, carotid bruits, or masses Cardiac: RRR; no murmurs, rubs, or gallops,no edema  Respiratory:  clear to auscultation bilaterally, normal work of breathing GI: soft, nontender, nondistended, + BS MS: no deformity or atrophy  Skin: warm and dry, no rash Neuro:  Strength and sensation are intact Psych: euthymic mood, full affect   EKG:   The ekg ordered today demonstrates NSR, LAD, no ST changes   Recent Labs: No results found for requested labs within last 8760 hours.   Lipid Panel    Component Value Date/Time   CHOL 99 10/09/2014 0812   TRIG 71.0 10/09/2014 0812   HDL 28.90 (L) 10/09/2014 0812   CHOLHDL 3 10/09/2014 0812   VLDL 14.2 10/09/2014 0812   LDLCALC 56 10/09/2014 0812     Other studies Reviewed: Additional studies/ records that were reviewed today with results demonstrating: cath report reviewed.   ASSESSMENT AND PLAN:  1. CAD/Old MI: Aspirin alone.   Sometimes skips a dose of aspirin due to prior nosebleeds.  No CHF.  2. HTN: BP controlled. The current medical regimen is effective;  continue present plan and medications. 3. Hyperlipidemia: Continue with lipid lowering therapy. Continue atorvastatin.  PMD checks labs.  4. Ankle edema: resolved.     Current medicines are reviewed at length with the patient today.  The patient concerns regarding his medicines were addressed.  The following changes have been made:  No change  Labs/ tests ordered today include:  No orders of the defined types were placed in this encounter.   Recommend 150 minutes/week of aerobic exercise Low fat, low carb, high fiber diet recommended  Disposition:   FU in 1 year   Signed, Larae Grooms, MD  03/01/2018 2:54 PM    [facility] [place], [place], [place]  [address] Phone: [phone]; Fax: [phone]

## 2018-03-19 ENCOUNTER — Other Ambulatory Visit: Payer: Self-pay | Admitting: Interventional Cardiology

## 2019-02-02 ENCOUNTER — Other Ambulatory Visit: Payer: Self-pay | Admitting: Interventional Cardiology

## 2019-05-02 ENCOUNTER — Other Ambulatory Visit: Payer: Self-pay | Admitting: Interventional Cardiology

## 2019-05-02 NOTE — Progress Notes (Signed)
Cardiology Office Note   Date:  05/03/2019   ID:  [name], DOB 02-21-1941, MRN [id number]  PCP:  [name], MD    No chief complaint on file.  CAD  Wt Readings from Last 3 Encounters:  05/03/19 207 lb 3.2 oz (94 kg)  03/01/18 209 lb (94.8 kg)  01/19/17 225 lb 6.4 oz (102.2 kg)       History of Present Illness: Angel West is a 78 y.o. male  with a history of remote RCA PTCA (Dr Karolee Ohs was his doctor then). He had no significant cardiac issues since. He presented 04/08/14 with an inferior STEMI. He received a BMS to his RCA. He had NL LVF and mild residual disease.   [place] very little due to work. 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   2015 cath showed: Patent left main coronary artery. Mild disease in the left anterior descending artery and its branches. Mild disease in the left circumflex artery and its branches. Occluded mid right coronary artery. This was the culprit for today's presentation. This was successfully treated with a 3.0 x 18 vision bare-metal stent, postdilated to 3.6 mm in diameter. A bare-metal stent was chosen because the patient has several invasive procedures including esophageal stretching later this month. Normal left ventricular systolic function. LVEDP 10 mmHg. Ejection fraction 55 %.  He has had nosebleeds in the past. Has had gout, treated with allopurinol.   Since the last visit, he has been staying active.  He uses some kind of vibrating machine for 12 minutes a day.  He walks in the neighborhood.    His daughter had COVID - 64.  Denies : Chest pain. Dizziness. Leg edema. Nitroglycerin use. Orthopnea. Palpitations. Paroxysmal nocturnal dyspnea. Shortness of breath. Syncope.   He maintained his weight loss.   He wears a mask and social distances.     Past Medical History:  Diagnosis Date  . CAD S/P percutaneous coronary angioplasty    hx of prior remote PCI  . Cancer [facility]) prostate   . COPD (chronic obstructive pulmonary disease) ([place])   . Dyslipidemia    low HDL  . Dysphagia 2012   esophageal diliation  . Hypertension   . Prostate cancer ([place]) 2005   surg   . S/P angioplasty with stent mRCA with BMS- vision 04/08/2014  . STEMI (ST elevation myocardial infarction) ([place]) 04/08/14  . Tobacco use 04/10/2014    Past Surgical History:  Procedure Laterality Date  . BALLOON DILATION  08/25/2011   Procedure: BALLOON DILATION;  Surgeon: Garlan Fair, MD;  Location: [facility];  Service: Endoscopy;  Laterality: N/A;  . BALLOON DILATION N/A 07/01/2014   Procedure: BALLOON DILATION;  Surgeon: Garlan Fair, MD;  Location: [facility];  Service: Endoscopy;  Laterality: N/A;  . COLONOSCOPY  2007  . CORONARY STENT PLACEMENT  04/08/14   RCA BMS  . ESOPHAGOGASTRODUODENOSCOPY  08/25/2011   Procedure: ESOPHAGOGASTRODUODENOSCOPY (EGD);  Surgeon: Garlan Fair, MD;  Location: [facility];  Service: Endoscopy;  Laterality: N/A;  . ESOPHAGOGASTRODUODENOSCOPY N/A 07/01/2014   Procedure: ESOPHAGOGASTRODUODENOSCOPY (EGD);  Surgeon: Garlan Fair, MD;  Location: [facility];  Service: Endoscopy;  Laterality: N/A;  . LEFT HEART CATHETERIZATION WITH CORONARY ANGIOGRAM N/A 04/08/2014   Procedure: LEFT HEART CATHETERIZATION WITH CORONARY ANGIOGRAM;  Surgeon: Jettie Booze, MD;  Location: [facility] CATH LAB;  Service: Cardiovascular;  Laterality: N/A;  . PROSTATECTOMY  12/15/2003     Current Outpatient Medications  Medication Sig Dispense Refill  . amoxicillin (AMOXIL) 875 MG tablet     . aspirin 81 MG tablet Take 81 mg by mouth 3 (three) times a week. Take one tablet by mouth every M W F    . atorvastatin (LIPITOR) 40 MG tablet TAKE ONE TABLET BY MOUTH DAILY 90 tablet 3  . carvedilol (COREG) 3.125 MG tablet TAKE ONE TABLET BY MOUTH TWICE A DAY WITH A MEAL 180 tablet 3  . cholecalciferol (VITAMIN D) 1000 UNITS tablet Take 1,000 Units by mouth daily.      Marland Kitchen MAGNESIUM PO Take 1  tablet by mouth daily with supper.    . nitroGLYCERIN (NITROSTAT) 0.4 MG SL tablet Place 1 tablet (0.4 mg total) under the tongue every 5 (five) minutes x 3 doses as needed for chest pain. 25 tablet 4  . OMEPRAZOLE PO Take 1 tablet by mouth 3 (three) times a week. Take one tablet by mouth every M W F     No current facility-administered medications for this visit.     Allergies:   Patient has no known allergies.    Social History:  The patient  reports that he quit smoking about 5 years ago. His smoking use included cigars. He smoked 1.00 pack per day. He has never used smokeless tobacco. He reports that he does not drink alcohol or use drugs.   Family History:  The patient's family history includes Stroke in his mother.    ROS:  Please see the history of present illness.   Otherwise, review of systems are positive for decreased exercise recently.   All other systems are reviewed and negative.    PHYSICAL EXAM: VS:  BP 110/72   Pulse 68   Ht 5' 9.5" (1.765 m)   Wt 207 lb 3.2 oz (94 kg)   SpO2 97%   BMI 30.16 kg/m  , BMI Body mass index is 30.16 kg/m. GEN: Well nourished, well developed, in no acute distress  HEENT: normal  Neck: no JVD, carotid bruits, or masses Cardiac: RRR; no murmurs, rubs, or gallops,no edema  Respiratory:  clear to auscultation bilaterally, normal work of breathing GI: soft, nontender, nondistended, + BS MS: no deformity or atrophy ;  Bruise on the left leg Skin: warm and dry, no rash Neuro:  Strength and sensation are intact Psych: euthymic mood, full affect   EKG:   The ekg ordered today demonstrates NSR, no ST changes   Recent Labs: No results found for requested labs within last 8760 hours.   Lipid Panel    Component Value Date/Time   CHOL 99 10/09/2014 0812   TRIG 71.0 10/09/2014 0812   HDL 28.90 (L) 10/09/2014 0812   CHOLHDL 3 10/09/2014 0812   VLDL 14.2 10/09/2014 0812   LDLCALC 56 10/09/2014 0812     Other studies Reviewed:  Additional studies/ records that were reviewed today with results demonstrating: labs reviewed .   ASSESSMENT AND PLAN:   1.   CAD/Old MI: No angina. COntinue aggressive secondary prevention.  Any sx like MI, he will let us know. 2.   HTN: The current medical regimen is effective;  continue present plan and medications. 3.   Hyperlipidemia: LDL below 70 in 03/2019.  4.   Ankle edema: Resolved.    Current medicines are reviewed at length with the patient today.  The patient concerns regarding his medicines were addressed.  The following changes have been made:  No change  Labs/ tests ordered today include:  No  orders of the defined types were placed in this encounter.   Recommend 150 minutes/week of aerobic exercise Low fat, low carb, high fiber diet recommended  Disposition:   FU in 1 year   Signed, Larae Grooms, MD  05/03/2019 4:04 PM    [facility] [place], [place], [place]  [address] Phone: [phone]; Fax: [phone]

## 2019-05-03 ENCOUNTER — Ambulatory Visit (INDEPENDENT_AMBULATORY_CARE_PROVIDER_SITE_OTHER): Payer: Medicare Other | Admitting: Interventional Cardiology

## 2019-05-03 ENCOUNTER — Encounter: Payer: Self-pay | Admitting: Interventional Cardiology

## 2019-05-03 ENCOUNTER — Other Ambulatory Visit: Payer: Self-pay

## 2019-05-03 VITALS — BP 110/72 | HR 68 | Ht 69.5 in | Wt 207.2 lb

## 2019-05-03 DIAGNOSIS — I25119 Atherosclerotic heart disease of native coronary artery with unspecified angina pectoris: Secondary | ICD-10-CM

## 2019-05-03 DIAGNOSIS — I10 Essential (primary) hypertension: Secondary | ICD-10-CM

## 2019-05-03 DIAGNOSIS — I252 Old myocardial infarction: Secondary | ICD-10-CM | POA: Diagnosis not present

## 2019-05-03 DIAGNOSIS — E782 Mixed hyperlipidemia: Secondary | ICD-10-CM | POA: Diagnosis not present

## 2019-05-03 DIAGNOSIS — R600 Localized edema: Secondary | ICD-10-CM

## 2019-05-03 NOTE — Patient Instructions (Signed)
Medication Instructions:  Your physician recommends that you continue on your current medications as directed. Please refer to the Current Medication list given to you today.  If you need a refill on your cardiac medications before your next appointment, please call your pharmacy.   Lab work: None Ordered  If you have labs (blood work) drawn today and your tests are completely normal, you will receive your results only by: Marland Kitchen MyChart Message (if you have MyChart) OR . A paper copy in the mail If you have any lab test that is abnormal or we need to change your treatment, we will call you to review the results.  Testing/Procedures: None Ordered  Follow-Up: At [facility], you and your health needs are our priority.  As part of our continuing mission to provide you with exceptional heart care, we have created designated Provider Care Teams.  These Care Teams include your primary Cardiologist (physician) and Advanced Practice Providers (APPs -  Physician Assistants and Nurse Practitioners) who all work together to provide you with the care you need, when you need it. You will need a follow up appointment in 12 months.  Please call our office 2 months in advance to schedule this appointment.  You may see Dr. Irish Lack or one of the following Advanced Practice Providers on your designated Care Team:   [place], PA-C Melina Copa, PA-C . Ermalinda Barrios, PA-C  Any Other Special Instructions Will Be Listed Below (If Applicable).

## 2019-12-17 ENCOUNTER — Ambulatory Visit (INDEPENDENT_AMBULATORY_CARE_PROVIDER_SITE_OTHER): Payer: Medicare Other | Admitting: Otolaryngology

## 2019-12-17 ENCOUNTER — Encounter (INDEPENDENT_AMBULATORY_CARE_PROVIDER_SITE_OTHER): Payer: Self-pay | Admitting: Otolaryngology

## 2019-12-17 ENCOUNTER — Other Ambulatory Visit: Payer: Self-pay

## 2019-12-17 VITALS — Temp 97.2°F

## 2019-12-17 DIAGNOSIS — H903 Sensorineural hearing loss, bilateral: Secondary | ICD-10-CM | POA: Diagnosis not present

## 2019-12-17 DIAGNOSIS — H6123 Impacted cerumen, bilateral: Secondary | ICD-10-CM | POA: Diagnosis not present

## 2019-12-17 NOTE — Progress Notes (Signed)
HPI: Angel West is a 79 y.o. male who presents for evaluation of wax buildup in both ears.  He also has itching of the outer portion of the ear.  He wears bilateral hearing aids that [facility]  Past Medical History:  Diagnosis Date  . CAD S/P percutaneous coronary angioplasty    hx of prior remote PCI  . Cancer [facility]) prostate  . COPD (chronic obstructive pulmonary disease) ([place])   . Dyslipidemia    low HDL  . Dysphagia 2012   esophageal diliation  . Hypertension   . Prostate cancer ([place]) 2005   surg   . S/P angioplasty with stent mRCA with BMS- vision 04/08/2014  . STEMI (ST elevation myocardial infarction) ([place]) 04/08/14  . Tobacco use 04/10/2014   Past Surgical History:  Procedure Laterality Date  . BALLOON DILATION  08/25/2011   Procedure: BALLOON DILATION;  Surgeon: Garlan Fair, MD;  Location: [facility];  Service: Endoscopy;  Laterality: N/A;  . BALLOON DILATION N/A 07/01/2014   Procedure: BALLOON DILATION;  Surgeon: Garlan Fair, MD;  Location: [facility];  Service: Endoscopy;  Laterality: N/A;  . COLONOSCOPY  2007  . CORONARY STENT PLACEMENT  04/08/14   RCA BMS  . ESOPHAGOGASTRODUODENOSCOPY  08/25/2011   Procedure: ESOPHAGOGASTRODUODENOSCOPY (EGD);  Surgeon: Garlan Fair, MD;  Location: [facility];  Service: Endoscopy;  Laterality: N/A;  . ESOPHAGOGASTRODUODENOSCOPY N/A 07/01/2014   Procedure: ESOPHAGOGASTRODUODENOSCOPY (EGD);  Surgeon: Garlan Fair, MD;  Location: [facility];  Service: Endoscopy;  Laterality: N/A;  . LEFT HEART CATHETERIZATION WITH CORONARY ANGIOGRAM N/A 04/08/2014   Procedure: LEFT HEART CATHETERIZATION WITH CORONARY ANGIOGRAM;  Surgeon: Jettie Booze, MD;  Location: [facility] CATH LAB;  Service: Cardiovascular;  Laterality: N/A;  . PROSTATECTOMY  12/15/2003   Social History   Socioeconomic History  . Marital status: Married    Spouse name: Not on file  . Number of children: Not on file  . Years of  education: Not on file  . Highest education level: Not on file  Occupational History  . Not on file  Tobacco Use  . Smoking status: Former Smoker    Packs/day: 1.00    Years: 56.00    Pack years: 56.00    Types: Cigars    Start date: 62    Quit date: 04/08/2014    Years since quitting: 5.6  . Smokeless tobacco: Never Used  . Tobacco comment: pt reports he has quit smoking without difficulty or urges  Substance and Sexual Activity  . Alcohol use: No  . Drug use: No  . Sexual activity: Not on file  Other Topics Concern  . Not on file  Social History Narrative  . Not on file   Social Determinants of Health   Financial Resource Strain:   . Difficulty of Paying Living Expenses:   Food Insecurity:   . Worried About Charity fundraiser in the Last Year:   . Arboriculturist in the Last Year:   Transportation Needs:   . Film/video editor (Medical):   Marland Kitchen Lack of Transportation (Non-Medical):   Physical Activity:   . Days of Exercise per Week:   . Minutes of Exercise per Session:   Stress:   . Feeling of Stress :   Social Connections:   . Frequency of Communication with Friends and Family:   . Frequency of Social Gatherings with Friends and Family:   . Attends Religious Services:   . Active Member  of Clubs or Organizations:   . Attends Archivist Meetings:   Marland Kitchen Marital Status:    Family History  Problem Relation Age of Onset  . Stroke Mother   . Anesthesia problems Neg Hx   . Malignant hyperthermia Neg Hx   . Heart attack Neg Hx   . Hypertension Neg Hx    No Known Allergies Prior to Admission medications   Medication Sig Start Date End Date Taking? Authorizing Provider  aspirin 81 MG tablet Take 81 mg by mouth 3 (three) times a week. Take one tablet by mouth every M W F   Yes [provider]  atorvastatin (LIPITOR) 40 MG tablet TAKE ONE TABLET BY MOUTH DAILY 05/02/19  Yes Jettie Booze, MD  carvedilol (COREG) 3.125 MG tablet TAKE ONE TABLET  BY MOUTH TWICE A DAY WITH A MEAL 05/02/19  Yes Jettie Booze, MD  cholecalciferol (VITAMIN D) 1000 UNITS tablet Take 1,000 Units by mouth daily.     Yes [provider]  MAGNESIUM PO Take 1 tablet by mouth daily with supper.   Yes [provider]  nitroGLYCERIN (NITROSTAT) 0.4 MG SL tablet Place 1 tablet (0.4 mg total) under the tongue every 5 (five) minutes x 3 doses as needed for chest pain. 01/19/17  Yes Jettie Booze, MD  OMEPRAZOLE PO Take 1 tablet by mouth 3 (three) times a week. Take one tablet by mouth every M W F   Yes [provider]  amoxicillin (AMOXIL) 875 MG tablet  01/24/19   [provider]     Positive ROS: Otherwise negative  All other systems have been reviewed and were otherwise negative with the exception of those mentioned in the HPI and as above.  Physical Exam: Constitutional: Alert, well-appearing, no acute distress Ears: External ears without lesions or tenderness. Ear canals moderate wax buildup on both sides left side worse than right.  This was cleaned with suction and hydrogen peroxide.  TMs were clear bilaterally.  No signs of infection and no external otitis.. Nasal: External nose without lesions. Clear nasal passages Oral: Oropharynx clear. Neck: No palpable adenopathy or masses Respiratory: Breathing comfortably  Skin: No facial/neck lesions or rash noted.  Cerumen impaction removal  Date/Time: 12/17/2019 2:17 PM Performed by: Rozetta Nunnery, MD Authorized by: Rozetta Nunnery, MD   Consent:    Consent obtained:  Verbal   Consent given by:  Patient   Risks discussed:  Pain and bleeding Procedure details:    Location:  L ear and R ear   Procedure type: suction   Post-procedure details:    Inspection:  TM intact and canal normal   Hearing quality:  Improved   Patient tolerance of procedure:  Tolerated well, no immediate complications Comments:     TMs are clear  bilaterally.    Assessment: Bilateral cerumen buildup Bilateral hearing loss uses hearing aids.  Plan: Ear canals were cleaned in the office. I prescribed Diprolene 0.05% cream to use twice daily for 3 to 4 days as needed itching. He will follow up on annual basis to have his ears cleaned.  Radene Journey, MD

## 2020-05-02 ENCOUNTER — Other Ambulatory Visit: Payer: Self-pay | Admitting: Interventional Cardiology

## 2020-05-04 NOTE — Progress Notes (Signed)
Cardiology Office Note   Date:  05/05/2020   ID:  [name], DOB 04/02/1941, MRN [id number]  PCP:  [name], MD    No chief complaint on file.  CAD  Wt Readings from Last 3 Encounters:  05/05/20 210 lb (95.3 kg)  05/03/19 207 lb 3.2 oz (94 kg)  03/01/18 209 lb (94.8 kg)       History of Present Illness: Angel West is a 79 y.o. male  with a history of remote RCA PTCA (Dr Karolee Ohs was his doctor then). He had no significant cardiac issues since. He presented 04/08/14 with an inferior STEMI. He received a BMS to his RCA. He had NL LVF and mild residual disease.   Hewalks very little due to work. He does computer workand works as an Optometrist.   2015 cath showed: Patent left main coronary artery. Mild disease in the left anterior descending artery and its branches. Mild disease in the left circumflex artery and its branches. Occluded mid right coronary artery. This was the culprit for today's presentation. This was successfully treated with a 3.0 x 18 vision bare-metal stent, postdilated to 3.6 mm in diameter. A bare-metal stent was chosen because the patient has several invasive procedures including esophageal stretching later this month. Normal left ventricular systolic function. LVEDP 10 mmHg. Ejection fraction 55 %.  He has had nosebleeds in the past.Has had gout, treated with allopurinol.   In 2020, it was noted that, he has been staying active.  He uses some kind of vibrating machine for 12 minutes a day.  He walks in the neighborhood.    His daughter had COVID - 91.  She has had longterm headaches.  He has felt well.  Denies : Chest pain. Dizziness. Leg edema. Nitroglycerin use. Orthopnea. Palpitations. Paroxysmal nocturnal dyspnea. Shortness of breath. Syncope.   He got his COVID vaccines.  His wife has not.    Past Medical History:  Diagnosis Date  . CAD S/P percutaneous coronary angioplasty    hx of prior remote PCI    . Cancer [facility]) prostate  . COPD (chronic obstructive pulmonary disease) ([place])   . Dyslipidemia    low HDL  . Dysphagia 2012   esophageal diliation  . Hypertension   . Prostate cancer ([place]) 2005   surg   . S/P angioplasty with stent mRCA with BMS- vision 04/08/2014  . STEMI (ST elevation myocardial infarction) ([place]) 04/08/14  . Tobacco use 04/10/2014    Past Surgical History:  Procedure Laterality Date  . BALLOON DILATION  08/25/2011   Procedure: BALLOON DILATION;  Surgeon: Garlan Fair, MD;  Location: [facility];  Service: Endoscopy;  Laterality: N/A;  . BALLOON DILATION N/A 07/01/2014   Procedure: BALLOON DILATION;  Surgeon: Garlan Fair, MD;  Location: [facility];  Service: Endoscopy;  Laterality: N/A;  . COLONOSCOPY  2007  . CORONARY STENT PLACEMENT  04/08/14   RCA BMS  . ESOPHAGOGASTRODUODENOSCOPY  08/25/2011   Procedure: ESOPHAGOGASTRODUODENOSCOPY (EGD);  Surgeon: Garlan Fair, MD;  Location: [facility];  Service: Endoscopy;  Laterality: N/A;  . ESOPHAGOGASTRODUODENOSCOPY N/A 07/01/2014   Procedure: ESOPHAGOGASTRODUODENOSCOPY (EGD);  Surgeon: Garlan Fair, MD;  Location: [facility];  Service: Endoscopy;  Laterality: N/A;  . LEFT HEART CATHETERIZATION WITH CORONARY ANGIOGRAM N/A 04/08/2014   Procedure: LEFT HEART CATHETERIZATION WITH CORONARY ANGIOGRAM;  Surgeon: Jettie Booze, MD;  Location: [facility] CATH LAB;  Service: Cardiovascular;  Laterality: N/A;  . PROSTATECTOMY  12/15/2003  Current Outpatient Medications  Medication Sig Dispense Refill  . aspirin 81 MG tablet Take 81 mg by mouth 3 (three) times a week. Take one tablet by mouth every M W F    . atorvastatin (LIPITOR) 40 MG tablet TAKE ONE TABLET BY MOUTH DAILY 90 tablet 3  . carvedilol (COREG) 3.125 MG tablet TAKE ONE TABLET BY MOUTH TWICE A DAY WITH A MEAL 180 tablet 3  . cholecalciferol (VITAMIN D) 1000 UNITS tablet Take 1,000 Units by mouth daily.      Marland Kitchen MAGNESIUM PO Take 1 tablet by mouth  daily with supper.    . nitroGLYCERIN (NITROSTAT) 0.4 MG SL tablet Place 1 tablet (0.4 mg total) under the tongue every 5 (five) minutes x 3 doses as needed for chest pain. 25 tablet 4  . OMEPRAZOLE PO Take 1 tablet by mouth 3 (three) times a week. Take one tablet by mouth every M W F     No current facility-administered medications for this visit.    Allergies:   Patient has no known allergies.    Social History:  The patient  reports that he quit smoking about 6 years ago. His smoking use included cigars. He started smoking about 69 years ago. He has a 56.00 pack-year smoking history. He has never used smokeless tobacco. He reports that he does not drink alcohol and does not use drugs.   Family History:  The patient's family history includes Stroke in his mother.    ROS:  Please see the history of present illness.   Otherwise, review of systems are positive for less exercise.   All other systems are reviewed and negative.    PHYSICAL EXAM: VS:  BP 122/70   Pulse 68   Ht 5' 9.5" (1.765 m)   Wt 210 lb (95.3 kg)   SpO2 97%   BMI 30.57 kg/m  , BMI Body mass index is 30.57 kg/m. GEN: Well nourished, well developed, in no acute distress  HEENT: normal  Neck: no JVD, carotid bruits, or masses Cardiac: RRR; no murmurs, rubs, or gallops,no edema  Respiratory:  clear to auscultation bilaterally, normal work of breathing GI: soft, nontender, nondistended, + BS, obese MS: no deformity or atrophy  Skin: warm and dry, no rash Neuro:  Strength and sensation are intact Psych: euthymic mood, full affect   EKG:   The ekg ordered today demonstrates NSR, PAC, RBBB   Recent Labs: No results found for requested labs within last 8760 hours.   Lipid Panel    Component Value Date/Time   CHOL 99 10/09/2014 0812   TRIG 71.0 10/09/2014 0812   HDL 28.90 (L) 10/09/2014 0812   CHOLHDL 3 10/09/2014 0812   VLDL 14.2 10/09/2014 0812   LDLCALC 56 10/09/2014 0812     Other studies  Reviewed: Additional studies/ records that were reviewed today with results demonstrating: cath results reviewed.   ASSESSMENT AND PLAN:    1. CAD/Old MI: No angina on medical therapy.  Bare metal stent in the RCA. Continue aggressive secondary prevention.  2. HTN: The current medical regimen is effective;  continue present plan and medications. 3. Hyperlipidemia: Followed with PMD. Contiue atorvastatin daily. 4. Ankle edema: Noted in the past. Resolved.   Current medicines are reviewed at length with the patient today.  The patient concerns regarding his medicines were addressed.  The following changes have been made:  No change  Labs/ tests ordered today include:  No orders of the defined types were placed in this  encounter.   Recommend 150 minutes/week of aerobic exercise Low fat, low carb, high fiber diet recommended  Disposition:   FU in 1 year    Signed, Larae Grooms, MD  05/05/2020 2:30 PM    [facility], [place], [place]  [address] Phone: [phone]; Fax: [phone]

## 2020-05-05 ENCOUNTER — Encounter: Payer: Self-pay | Admitting: Interventional Cardiology

## 2020-05-05 ENCOUNTER — Other Ambulatory Visit: Payer: Self-pay

## 2020-05-05 ENCOUNTER — Ambulatory Visit (INDEPENDENT_AMBULATORY_CARE_PROVIDER_SITE_OTHER): Payer: Medicare Other | Admitting: Interventional Cardiology

## 2020-05-05 VITALS — BP 122/70 | HR 68 | Ht 69.5 in | Wt 210.0 lb

## 2020-05-05 DIAGNOSIS — I25119 Atherosclerotic heart disease of native coronary artery with unspecified angina pectoris: Secondary | ICD-10-CM | POA: Diagnosis not present

## 2020-05-05 DIAGNOSIS — R600 Localized edema: Secondary | ICD-10-CM

## 2020-05-05 DIAGNOSIS — E782 Mixed hyperlipidemia: Secondary | ICD-10-CM | POA: Diagnosis not present

## 2020-05-05 DIAGNOSIS — I10 Essential (primary) hypertension: Secondary | ICD-10-CM | POA: Diagnosis not present

## 2020-05-05 DIAGNOSIS — I252 Old myocardial infarction: Secondary | ICD-10-CM | POA: Diagnosis not present

## 2020-05-05 MED ORDER — NITROGLYCERIN 0.4 MG SL SUBL: 0.4000 mg | SUBLINGUAL_TABLET | SUBLINGUAL | 3 refills | Status: AC | PRN

## 2020-05-05 NOTE — Patient Instructions (Signed)

## 2020-05-07 ENCOUNTER — Other Ambulatory Visit: Payer: Self-pay | Admitting: Interventional Cardiology

## 2021-04-30 ENCOUNTER — Other Ambulatory Visit: Payer: Self-pay | Admitting: Interventional Cardiology

## 2021-05-05 NOTE — Progress Notes (Signed)
Cardiology Office Note   Date:  05/06/2021   ID:  Angel West, DOB 12-16-1940, MRN [id number]  PCP:  [name], MD    No chief complaint on file.  CAD  Wt Readings from Last 3 Encounters:  05/06/21 217 lb 6.4 oz (98.6 kg)  05/05/20 210 lb (95.3 kg)  05/03/19 207 lb 3.2 oz (94 kg)       History of Present Illness: Angel West is a 80 y.o. male   with a history of remote RCA PTCA (Dr Karolee Ohs was his doctor then). He had no significant cardiac issues since. He presented 04/08/14 with an inferior STEMI. He received a BMS to his RCA. He had NL LVF and mild residual disease.    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    2015 cath showed: Patent left main coronary artery. Mild disease in the left anterior descending artery and its branches. Mild disease in the left circumflex artery and its branches. Occluded mid right coronary artery.  This was the culprit for today's presentation. This was successfully treated with a 3.0 x 18 vision bare-metal stent, postdilated to 3.6 mm in diameter. A bare-metal stent was chosen because the patient has several invasive procedures including esophageal stretching later this month. Normal left ventricular systolic function.  LVEDP 10 mmHg.  Ejection fraction 55 %.   He has had nosebleeds in the past. Has had gout, treated with allopurinol.    In 2020, it was noted that, he has been staying active.  He uses some kind of vibrating machine for 12 minutes a day.  He walks in the neighborhood.     His daughter had COVID - 77.  She has had longterm headaches.   He has felt well.  He got his COVID vaccines.   He has not been walking much.  He has lost two brother in laws in the last few weeks.     Past Medical History:  Diagnosis Date   CAD S/P percutaneous coronary angioplasty    hx of prior remote PCI   Cancer [facility]) prostate   COPD (chronic obstructive pulmonary disease) ([place])    Dyslipidemia    low HDL    Dysphagia 2012   esophageal diliation   Hypertension    Prostate cancer ([place]) 2005   surg    S/P angioplasty with stent mRCA with BMS- vision 04/08/2014   STEMI (ST elevation myocardial infarction) ([place]) 04/08/14   Tobacco use 04/10/2014    Past Surgical History:  Procedure Laterality Date   BALLOON DILATION  08/25/2011   Procedure: BALLOON DILATION;  Surgeon: Garlan Fair, MD;  Location: [facility];  Service: Endoscopy;  Laterality: N/A;   BALLOON DILATION N/A 07/01/2014   Procedure: BALLOON DILATION;  Surgeon: Garlan Fair, MD;  Location: [facility];  Service: Endoscopy;  Laterality: N/A;   COLONOSCOPY  2007   CORONARY STENT PLACEMENT  04/08/14   RCA BMS   ESOPHAGOGASTRODUODENOSCOPY  08/25/2011   Procedure: ESOPHAGOGASTRODUODENOSCOPY (EGD);  Surgeon: Garlan Fair, MD;  Location: [facility];  Service: Endoscopy;  Laterality: N/A;   ESOPHAGOGASTRODUODENOSCOPY N/A 07/01/2014   Procedure: ESOPHAGOGASTRODUODENOSCOPY (EGD);  Surgeon: Garlan Fair, MD;  Location: [facility];  Service: Endoscopy;  Laterality: N/A;   LEFT HEART CATHETERIZATION WITH CORONARY ANGIOGRAM N/A 04/08/2014   Procedure: LEFT HEART CATHETERIZATION WITH CORONARY ANGIOGRAM;  Surgeon: Jettie Booze, MD;  Location: [facility] CATH LAB;  Service: Cardiovascular;  Laterality: N/A;   PROSTATECTOMY  12/15/2003     Current Outpatient Medications  Medication Sig Dispense Refill   aspirin 81 MG tablet Take 81 mg by mouth 3 (three) times a week. Take one tablet by mouth every M W F     atorvastatin (LIPITOR) 40 MG tablet TAKE ONE TABLET BY MOUTH DAILY 90 tablet 3   carvedilol (COREG) 3.125 MG tablet TAKE ONE TABLET BY MOUTH TWICE A DAY WITH A MEAL 180 tablet 3   cholecalciferol (VITAMIN D) 1000 UNITS tablet Take 1,000 Units by mouth daily.       MAGNESIUM PO Take 1 tablet by mouth daily with supper.     nitroGLYCERIN (NITROSTAT) 0.4 MG SL tablet Place 1 tablet (0.4 mg total) under the tongue every 5 (five)  minutes x 3 doses as needed for chest pain. 25 tablet 3   OMEPRAZOLE PO Take 1 tablet by mouth 3 (three) times a week. Take one tablet by mouth every M W F     No current facility-administered medications for this visit.    Allergies:   Patient has no known allergies.    Social History:  The patient  reports that he quit smoking about 7 years ago. His smoking use included cigars and cigarettes. He started smoking about 70 years ago. He has a 56.00 pack-year smoking history. He has never used smokeless tobacco. He reports that he does not drink alcohol and does not use drugs.   Family History:  The patient's family history includes Stroke in his mother.    ROS:  Please see the history of present illness.   Otherwise, review of systems are positive for some stress from deaths in the family; issues if he does not eat slow with regurgitation is once a day so 2520.   All other systems are reviewed and negative.    PHYSICAL EXAM: VS:  BP 138/90   Pulse 74   Ht 5' 9.5" (1.765 m)   Wt 217 lb 6.4 oz (98.6 kg)   SpO2 95%   BMI 31.64 kg/m  , BMI Body mass index is 31.64 kg/m. GEN: Well nourished, well developed, in no acute distress HEENT: normal Neck: no JVD, carotid bruits, or masses Cardiac: RRR; no murmurs, rubs, or gallops,no edema  Respiratory:  clear to auscultation bilaterally, normal work of breathing GI: soft, nontender, nondistended, + BS, obesity MS: no deformity or atrophy Skin: warm and dry, no rash Neuro:  Strength and sensation are intact Psych: euthymic mood, flat affect   EKG:   The ekg ordered today demonstrates NSR, RBBB, PAC   Recent Labs: No results found for requested labs within last 8760 hours.   Lipid Panel    Component Value Date/Time   CHOL 99 10/09/2014 0812   TRIG 71.0 10/09/2014 0812   HDL 28.90 (L) 10/09/2014 0812   CHOLHDL 3 10/09/2014 0812   VLDL 14.2 10/09/2014 0812   LDLCALC 56 10/09/2014 0812     Other studies Reviewed: Additional  studies/ records that were reviewed today with results demonstrating: prior records reviewed.   ASSESSMENT AND PLAN:  CAD/Old MI: Continue aggressive secondary prevention.  Continue activity targets as noted below.  No angina.  HTN: Avoid excessive salt in diet.  The current medical regimen is effective;  continue present plan and medications.  Borderline control today.  Does eat a fair amount of processed meats.  Not checking BP at home. Has been increased when he has gout and pain.  He is following up with PCP.  Increase walking  and decrease salt to improve BP so no more meds are needed.  If BP stays up, could increase Coreg 6.25 mg BID.  Hyperlipidemia: Whole food, plant-based diet recommended.  Avoid processed foods COntinue high potency statin.  LDL target < 70.  Checked with PCP.  Ankle edema: Elevate legs when possible.  Could consider compression stockings. Obesity: healthy diet.  Increase exercise.  Hoping to try to limit adding medications through improving lifestyle.   Current medicines are reviewed at length with the patient today.  The patient concerns regarding his medicines were addressed.  The following changes have been made:  No change  Labs/ tests ordered today include:  No orders of the defined types were placed in this encounter.   Recommend 150 minutes/week of aerobic exercise Low fat, low carb, high fiber diet recommended  Disposition:   FU in 1 year   Signed, Larae Grooms, MD  05/06/2021 1:31 PM    [facility] [place], [place], [place]  [address] Phone: [phone]; Fax: [phone]

## 2021-05-06 ENCOUNTER — Other Ambulatory Visit: Payer: Self-pay

## 2021-05-06 ENCOUNTER — Ambulatory Visit (INDEPENDENT_AMBULATORY_CARE_PROVIDER_SITE_OTHER): Payer: Medicare Other | Admitting: Interventional Cardiology

## 2021-05-06 ENCOUNTER — Encounter: Payer: Self-pay | Admitting: Interventional Cardiology

## 2021-05-06 VITALS — BP 138/90 | HR 74 | Ht 69.5 in | Wt 217.4 lb

## 2021-05-06 DIAGNOSIS — I10 Essential (primary) hypertension: Secondary | ICD-10-CM | POA: Diagnosis not present

## 2021-05-06 DIAGNOSIS — E669 Obesity, unspecified: Secondary | ICD-10-CM

## 2021-05-06 DIAGNOSIS — I252 Old myocardial infarction: Secondary | ICD-10-CM

## 2021-05-06 DIAGNOSIS — I25119 Atherosclerotic heart disease of native coronary artery with unspecified angina pectoris: Secondary | ICD-10-CM

## 2021-05-06 DIAGNOSIS — E782 Mixed hyperlipidemia: Secondary | ICD-10-CM | POA: Diagnosis not present

## 2021-05-06 DIAGNOSIS — R600 Localized edema: Secondary | ICD-10-CM

## 2021-05-06 NOTE — Patient Instructions (Addendum)
Medication Instructions:  Your physician recommends that you continue on your current medications as directed. Please refer to the Current Medication list given to you today.  *If you need a refill on your cardiac medications before your next appointment, please call your pharmacy*   Lab Work: none If you have labs (blood work) drawn today and your tests are completely normal, you will receive your results only by: [place] (if you have MyChart) OR A paper copy in the mail If you have any lab test that is abnormal or we need to change your treatment, we will call you to review the results.   Testing/Procedures: none   Follow-Up: At [facility], you and your health needs are our priority.  As part of our continuing mission to provide you with exceptional heart care, we have created designated Provider Care Teams.  These Care Teams include your primary Cardiologist (physician) and Advanced Practice Providers (APPs -  Physician Assistants and Nurse Practitioners) who all work together to provide you with the care you need, when you need it.  We recommend signing up for the patient portal called "MyChart".  Sign up information is provided on this After Visit Summary.  MyChart is used to connect with patients for Virtual Visits (Telemedicine).  Patients are able to view lab/test results, encounter notes, upcoming appointments, etc.  Non-urgent messages can be sent to your provider as well.   To learn more about what you can do with MyChart, go to [email or web address].    Your next appointment:   12 month(s)  The format for your next appointment:   In Person  Provider:   You may see Larae Grooms, MD or one of the following Advanced Practice Providers on your designated Care Team:   Melina Copa, PA-C Ermalinda Barrios, PA-C   Other Instructions   Your provider recommends that you maintain 150 minutes per week of moderate aerobic activity.   High-Fiber Eating  Plan Fiber, also called dietary fiber, is a type of carbohydrate. It is found foods such as fruits, vegetables, whole grains, and beans. A high-fiber diet can have many health benefits. Your health care provider may recommend a high-fiber diet to help: Prevent constipation. Fiber can make your bowel movements more regular. Lower your cholesterol. Relieve the following conditions: Inflammation of veins in the anus (hemorrhoids). Inflammation of specific areas of the digestive tract (uncomplicated diverticulosis). A problem of the large intestine, also called the colon, that sometimes causes pain and diarrhea (irritable bowel syndrome, or IBS). Prevent overeating as part of a weight-loss plan. Prevent heart disease, type 2 diabetes, and certain cancers. What are tips for following this plan? Reading food labels  Check the nutrition facts label on food products for the amount of dietary fiber. Choose foods that have 5 grams of fiber or more per serving. The goals for recommended daily fiber intake include: Men (age 51 or younger): 34-38 g. Men (over age 64): 28-34 g. Women (age 52 or younger): 25-28 g. Women (over age 68): 22-25 g. Your daily fiber goal is _____________ g. Shopping Choose whole fruits and vegetables instead of processed forms, such as apple juice or applesauce. Choose a wide variety of high-fiber foods such as avocados, lentils, oats, and kidney beans. Read the nutrition facts label of the foods you choose. Be aware of foods with added fiber. These foods often have high sugar and sodium amounts per serving. Cooking Use whole-grain flour for baking and cooking. Cook with brown rice instead of white rice.  Meal planning Start the day with a breakfast that is high in fiber, such as a cereal that contains 5 g of fiber or more per serving. Eat breads and cereals that are made with whole-grain flour instead of refined flour or white flour. Eat brown rice, bulgur wheat, or millet  instead of white rice. Use beans in place of meat in soups, salads, and pasta dishes. Be sure that half of the grains you eat each day are whole grains. General information You can get the recommended daily intake of dietary fiber by: Eating a variety of fruits, vegetables, grains, nuts, and beans. Taking a fiber supplement if you are not able to take in enough fiber in your diet. It is better to get fiber through food than from a supplement. Gradually increase how much fiber you consume. If you increase your intake of dietary fiber too quickly, you may have bloating, cramping, or gas. Drink plenty of water to help you digest fiber. Choose high-fiber snacks, such as berries, raw vegetables, nuts, and popcorn. What foods should I eat? Fruits Berries. Pears. Apples. Oranges. Avocado. Prunes and raisins. Dried figs. Vegetables Sweet potatoes. Spinach. Kale. Artichokes. Cabbage. Broccoli. Cauliflower. Green peas. Carrots. Squash. Grains Whole-grain breads. Multigrain cereal. Oats and oatmeal. Brown rice. Barley. Bulgur wheat. [place]. Quinoa. Bran muffins. Popcorn. Rye wafer crackers. Meats and other proteins Navy beans, kidney beans, and pinto beans. Soybeans. Split peas. Lentils. Nuts and seeds. Dairy Fiber-fortified yogurt. Beverages Fiber-fortified soy milk. Fiber-fortified orange juice. Other foods Fiber bars. The items listed above may not be a complete list of recommended foods and beverages. Contact a dietitian for more information. What foods should I avoid? Fruits Fruit juice. Cooked, strained fruit. Vegetables Fried potatoes. Canned vegetables. Well-cooked vegetables. Grains White bread. Pasta made with refined flour. White rice. Meats and other proteins Fatty cuts of meat. Fried chicken or fried fish. Dairy Milk. Yogurt. Cream cheese. Sour cream. Fats and oils Butters. Beverages Soft drinks. Other foods Cakes and pastries. The items listed above may not be a complete  list of foods and beverages to avoid. Talk with your dietitian about what choices are best for you. Summary Fiber is a type of carbohydrate. It is found in foods such as fruits, vegetables, whole grains, and beans. A high-fiber diet has many benefits. It can help to prevent constipation, lower blood cholesterol, aid weight loss, and reduce your risk of heart disease, diabetes, and certain cancers. Increase your intake of fiber gradually. Increasing fiber too quickly may cause cramping, bloating, and gas. Drink plenty of water while you increase the amount of fiber you consume. The best sources of fiber include whole fruits and vegetables, whole grains, nuts, seeds, and beans. This information is not intended to replace advice given to you by your health care provider. Make sure you discuss any questions you have with your health care provider. Document Revised: 12/26/2019 Document Reviewed: 12/26/2019 Elsevier Patient Education  2022 [employer].  Low-Sodium Eating Plan Sodium, which is an element that makes up salt, helps you maintain a healthy balance of fluids in your body. Too much sodium can increase your blood pressure and cause fluid and waste to be held in your body. Your health care provider or dietitian may recommend following this plan if you have high blood pressure (hypertension), kidney disease, liver disease, or heart failure. Eating less sodium can help lower your blood pressure, reduce swelling, and protect your heart, liver, and kidneys. What are tips for following this plan? Reading food  labels The Nutrition Facts label lists the amount of sodium in one serving of the food. If you eat more than one serving, you must multiply the listed amount of sodium by the number of servings. Choose foods with less than 140 mg of sodium per serving. Avoid foods with 300 mg of sodium or more per serving. Shopping  Look for lower-sodium products, often labeled as "low-sodium" or "no salt  added." Always check the sodium content, even if foods are labeled as "unsalted" or "no salt added." Buy fresh foods. Avoid canned foods and pre-made or frozen meals. Avoid canned, cured, or processed meats. Buy breads that have less than 80 mg of sodium per slice. Cooking  Eat more home-cooked food and less restaurant, buffet, and fast food. Avoid adding salt when cooking. Use salt-free seasonings or herbs instead of table salt or sea salt. Check with your health care provider or pharmacist before using salt substitutes. Cook with plant-based oils, such as canola, sunflower, or olive oil. Meal planning When eating at a restaurant, ask that your food be prepared with less salt or no salt, if possible. Avoid dishes labeled as brined, pickled, cured, smoked, or made with soy sauce, miso, or teriyaki sauce. Avoid foods that contain MSG (monosodium glutamate). MSG is sometimes added to [place] food, bouillon, and some canned foods. Make meals that can be grilled, baked, poached, roasted, or steamed. These are generally made with less sodium. General information Most people on this plan should limit their sodium intake to 1,500-2,000 mg (milligrams) of sodium each day. What foods should I eat? Fruits Fresh, frozen, or canned fruit. Fruit juice. Vegetables Fresh or frozen vegetables. "No salt added" canned vegetables. "No salt added" tomato sauce and paste. Low-sodium or reduced-sodium tomato and vegetable juice. Grains Low-sodium cereals, including oats, puffed wheat and rice, and shredded wheat. Low-sodium crackers. Unsalted rice. Unsalted pasta. Low-sodium bread. Whole-grain breads and whole-grain pasta. Meats and other proteins Fresh or frozen (no salt added) meat, poultry, seafood, and fish. Low-sodium canned tuna and salmon. Unsalted nuts. Dried peas, beans, and lentils without added salt. Unsalted canned beans. Eggs. Unsalted nut butters. Dairy Milk. Soy milk. Cheese that is naturally low  in sodium, such as ricotta cheese, fresh mozzarella, or Swiss cheese. Low-sodium or reduced-sodium cheese. Cream cheese. Yogurt. Seasonings and condiments Fresh and dried herbs and spices. Salt-free seasonings. Low-sodium mustard and ketchup. Sodium-free salad dressing. Sodium-free light mayonnaise. Fresh or refrigerated horseradish. Lemon juice. Vinegar. Other foods Homemade, reduced-sodium, or low-sodium soups. Unsalted popcorn and pretzels. Low-salt or salt-free chips. The items listed above may not be a complete list of foods and beverages you can eat. Contact a dietitian for more information. What foods should I avoid? Vegetables Sauerkraut, pickled vegetables, and relishes. Olives. [place] fries. Onion rings. Regular canned vegetables (not low-sodium or reduced-sodium). Regular canned tomato sauce and paste (not low-sodium or reduced-sodium). Regular tomato and vegetable juice (not low-sodium or reduced-sodium). Frozen vegetables in sauces. Grains Instant hot cereals. Bread stuffing, pancake, and biscuit mixes. Croutons. Seasoned rice or pasta mixes. Noodle soup cups. Boxed or frozen macaroni and cheese. Regular salted crackers. Self-rising flour. Meats and other proteins Meat or fish that is salted, canned, smoked, spiced, or pickled. Precooked or cured meat, such as sausages or meat loaves. Berniece Salines. Ham. Pepperoni. Hot dogs. Corned beef. Chipped beef. Salt pork. Jerky. Pickled herring. Anchovies and sardines. Regular canned tuna. Salted nuts. Dairy Processed cheese and cheese spreads. Hard cheeses. Cheese curds. Blue cheese. Feta cheese. String cheese. Regular cottage  cheese. Buttermilk. Canned milk. Fats and oils Salted butter. Regular margarine. Ghee. Bacon fat. Seasonings and condiments Onion salt, garlic salt, seasoned salt, table salt, and sea salt. Canned and packaged gravies. Worcestershire sauce. Tartar sauce. Barbecue sauce. Teriyaki sauce. Soy sauce, including reduced-sodium. Steak  sauce. Fish sauce. Oyster sauce. Cocktail sauce. Horseradish that you find on the shelf. Regular ketchup and mustard. Meat flavorings and tenderizers. Bouillon cubes. Hot sauce. Pre-made or packaged marinades. Pre-made or packaged taco seasonings. Relishes. Regular salad dressings. Salsa. Other foods Salted popcorn and pretzels. Corn chips and puffs. Potato and tortilla chips. Canned or dried soups. Pizza. Frozen entrees and pot pies. The items listed above may not be a complete list of foods and beverages you should avoid. Contact a dietitian for more information. Summary Eating less sodium can help lower your blood pressure, reduce swelling, and protect your heart, liver, and kidneys. Most people on this plan should limit their sodium intake to 1,500-2,000 mg (milligrams) of sodium each day. Canned, boxed, and frozen foods are high in sodium. Restaurant foods, fast foods, and pizza are also very high in sodium. You also get sodium by adding salt to food. Try to cook at home, eat more fresh fruits and vegetables, and eat less fast food and canned, processed, or prepared foods. This information is not intended to replace advice given to you by your health care provider. Make sure you discuss any questions you have with your health care provider. Document Revised: 09/27/2019 Document Reviewed: 07/24/2019 Elsevier Patient Education  2022 [employer].

## 2022-03-11 ENCOUNTER — Other Ambulatory Visit: Payer: Self-pay | Admitting: Gastroenterology

## 2022-03-11 DIAGNOSIS — K219 Gastro-esophageal reflux disease without esophagitis: Secondary | ICD-10-CM

## 2022-03-11 DIAGNOSIS — R1310 Dysphagia, unspecified: Secondary | ICD-10-CM

## 2022-03-11 DIAGNOSIS — Z9889 Other specified postprocedural states: Secondary | ICD-10-CM

## 2022-03-17 ENCOUNTER — Ambulatory Visit
Admission: RE | Admit: 2022-03-17 | Discharge: 2022-03-17 | Disposition: A | Discharge status: Home / Self Care | Payer: Medicare Other | Source: Ambulatory Visit | Attending: Gastroenterology | Admitting: Gastroenterology

## 2022-03-17 DIAGNOSIS — Z9889 Other specified postprocedural states: Secondary | ICD-10-CM

## 2022-03-17 DIAGNOSIS — K219 Gastro-esophageal reflux disease without esophagitis: Secondary | ICD-10-CM

## 2022-03-17 DIAGNOSIS — R1310 Dysphagia, unspecified: Secondary | ICD-10-CM

## 2022-04-30 ENCOUNTER — Other Ambulatory Visit: Payer: Self-pay | Admitting: Interventional Cardiology

## 2022-05-24 NOTE — Progress Notes (Unsigned)
Cardiology Office Note   Date:  05/25/2022   ID:  Angel West, DOB 04-04-1941, MRN [id number]  PCP:  Angel Panda, MD    No chief complaint on file.  CAD  Wt Readings from Last 3 Encounters:  05/25/22 207 lb 9.6 oz (94.2 kg)  05/06/21 217 lb 6.4 oz (98.6 kg)  05/05/20 210 lb (95.3 kg)       History of Present Illness: Angel West is a 81 y.o. male   with a history of remote RCA PTCA (Dr Angel West was his doctor then). He had no significant cardiac issues since. He presented 04/08/14 with an inferior STEMI. He received a BMS to his RCA. He had NL LVF and mild residual disease.    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    2015 cath showed: Patent left main coronary artery. Mild disease in the left anterior descending artery and its branches. Mild disease in the left circumflex artery and its branches. Occluded mid right coronary artery.  This was the culprit for today's presentation. This was successfully treated with a 3.0 x 18 vision bare-metal stent, postdilated to 3.6 mm in diameter. A bare-metal stent was chosen because the patient has several invasive procedures including esophageal stretching later this month. Normal left ventricular systolic function.  LVEDP 10 mmHg.  Ejection fraction 55 %.   He has had nosebleeds in the past. Has had gout, treated with allopurinol.    In 2020, it was noted that, he has been staying active.  He uses some kind of vibrating machine for 12 minutes a day.  He walks in the neighborhood.     His daughter had COVID - 26.  She has had longterm headaches.    He got his COVID vaccines.   Borderline BP in 2022.  Increasing Coreg was suggested if BP stayed elevated.   Had an esophageal stretch.  He reported some bradycardia.  He was hooked to a monitor and then allowed to leave.   He exercises now several days a week.    Denies : Chest pain. Dizziness. Nitroglycerin use. Orthopnea. Palpitations. Paroxysmal  nocturnal dyspnea. Shortness of breath. Syncope.      Past Medical History:  Diagnosis Date   CAD S/P percutaneous coronary angioplasty    hx of prior remote PCI   Cancer [facility]) prostate   COPD (chronic obstructive pulmonary disease) ([place])    Dyslipidemia    low HDL   Dysphagia 2012   esophageal diliation   Hypertension    Prostate cancer ([place]) 2005   surg    S/P angioplasty with stent mRCA with BMS- vision 04/08/2014   STEMI (ST elevation myocardial infarction) ([place]) 04/08/14   Tobacco use 04/10/2014    Past Surgical History:  Procedure Laterality Date   BALLOON DILATION  08/25/2011   Procedure: BALLOON DILATION;  Surgeon: Angel Fair, MD;  Location: [facility] ENDOSCOPY;  Service: Endoscopy;  Laterality: N/A;   BALLOON DILATION N/A 07/01/2014   Procedure: BALLOON DILATION;  Surgeon: Angel Fair, MD;  Location: [facility] ENDOSCOPY;  Service: Endoscopy;  Laterality: N/A;   COLONOSCOPY  2007   CORONARY STENT PLACEMENT  04/08/14   RCA BMS   ESOPHAGOGASTRODUODENOSCOPY  08/25/2011   Procedure: ESOPHAGOGASTRODUODENOSCOPY (EGD);  Surgeon: Angel Fair, MD;  Location: [facility];  Service: Endoscopy;  Laterality: N/A;   ESOPHAGOGASTRODUODENOSCOPY N/A 07/01/2014   Procedure: ESOPHAGOGASTRODUODENOSCOPY (EGD);  Surgeon: Angel Fair, MD;  Location: [facility]OS[facility];  Service: Endoscopy;  Laterality: N/A;   LEFT HEART CATHETERIZATION WITH CORONARY ANGIOGRAM N/A 04/08/2014   Procedure: LEFT HEART CATHETERIZATION WITH CORONARY ANGIOGRAM;  Surgeon: Angel Booze, MD;  Location: [facility] CATH LAB;  Service: Cardiovascular;  Laterality: N/A;   PROSTATECTOMY  12/15/2003     Current Outpatient Medications  Medication Sig Dispense Refill   aspirin 81 MG tablet Take 81 mg by mouth 3 (three) times a week. Take one tablet by mouth every M W F     atorvastatin (LIPITOR) 40 MG tablet TAKE ONE TABLET BY MOUTH DAILY 90 tablet 0   carvedilol (COREG) 3.125 MG tablet TAKE ONE TABLET BY MOUTH TWICE A DAY WITH  MEALS 180 tablet 0   cholecalciferol (VITAMIN D) 1000 UNITS tablet Take 1,000 Units by mouth daily.       MAGNESIUM PO Take 1 tablet by mouth daily with supper.     nitroGLYCERIN (NITROSTAT) 0.4 MG SL tablet Place 1 tablet (0.4 mg total) under the tongue every 5 (five) minutes x 3 doses as needed for chest pain. 25 tablet 3   OMEPRAZOLE PO Take 1 tablet by mouth 3 (three) times a week. Take one tablet by mouth every M W F     No current facility-administered medications for this visit.    Allergies:   Patient has no known allergies.    Social History:  The patient  reports that he quit smoking about 8 years ago. His smoking use included cigars and cigarettes. He started smoking about 71 years ago. He has a 56.00 pack-year smoking history. He has never used smokeless tobacco. He reports that he does not drink alcohol and does not use drugs.   Family History:  The patient's family history includes Stroke in his mother.    ROS:  Please see the history of present illness.   Otherwise, review of systems are positive for wife having high BP; patent has macular degen.   All other systems are reviewed and negative.    PHYSICAL EXAM: VS:  BP 138/84   Pulse 65   Ht 5' 9.5" (1.765 m)   Wt 207 lb 9.6 oz (94.2 kg)   SpO2 97%   BMI 30.22 kg/m  , BMI Body mass index is 30.22 kg/m. GEN: Well nourished, well developed, in no acute distress HEENT: normal Neck: no JVD, carotid bruits, or masses Cardiac: RRR, premature beats; no murmurs, rubs, or gallops,; mild bilateral ankle edema  Respiratory:  clear to auscultation bilaterally, normal work of breathing GI: soft, nontender, nondistended, + BS MS: no deformity or atrophy Skin: warm and dry, no rash Neuro:  Strength and sensation are intact Psych: euthymic mood, full affect   EKG:   The ekg ordered today demonstrates NSR, PVCs, no ST changes   Recent Labs: No results found for requested labs within last 365 days.   Lipid Panel     Component Value Date/Time   CHOL 99 10/09/2014 0812   TRIG 71.0 10/09/2014 0812   HDL 28.90 (L) 10/09/2014 0812   CHOLHDL 3 10/09/2014 0812   VLDL 14.2 10/09/2014 0812   LDLCALC 56 10/09/2014 0812     Other studies Reviewed: Additional studies/ records that were reviewed today with results demonstrating: most recent labs available.   ASSESSMENT AND PLAN:  CAD/Old MI: No angina on medical therapy.  No bleeding issues. HTN: The current medical regimen is effective;  continue present plan and medications.  Continue regular exercise. Hyperlipidemia: Checked by PMD. HDL was low per his report.  Obesity:  Whole food, plant based diet.  High-fiber diet.  Avoid processed foods. Ankle edema: persistent.  Elevate legs. 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  The PVCs may make his radial pulse seem slower than his actual heart rate.   Current medicines are reviewed at length with the patient today.  The patient concerns regarding his medicines were addressed.  The following changes have been made:  No change  Labs/ tests ordered today include:  No orders of the defined types were placed in this encounter.   Recommend 150 minutes/week of aerobic exercise Low fat, low carb, high fiber diet recommended  Disposition:   FU in 1 year   Signed, Larae Grooms, MD  05/25/2022 4:02 PM    [facility] [address], [place], [place]  [address] Phone: [phone]; Fax: [phone]

## 2022-05-25 ENCOUNTER — Ambulatory Visit: Payer: Medicare Other | Attending: Interventional Cardiology | Admitting: Interventional Cardiology

## 2022-05-25 ENCOUNTER — Encounter: Payer: Self-pay | Admitting: Interventional Cardiology

## 2022-05-25 VITALS — BP 138/84 | HR 65 | Ht 69.5 in | Wt 207.6 lb

## 2022-05-25 DIAGNOSIS — I493 Ventricular premature depolarization: Secondary | ICD-10-CM | POA: Insufficient documentation

## 2022-05-25 DIAGNOSIS — I10 Essential (primary) hypertension: Secondary | ICD-10-CM | POA: Insufficient documentation

## 2022-05-25 DIAGNOSIS — R600 Localized edema: Secondary | ICD-10-CM | POA: Diagnosis present

## 2022-05-25 DIAGNOSIS — E782 Mixed hyperlipidemia: Secondary | ICD-10-CM | POA: Insufficient documentation

## 2022-05-25 DIAGNOSIS — I25119 Atherosclerotic heart disease of native coronary artery with unspecified angina pectoris: Secondary | ICD-10-CM | POA: Insufficient documentation

## 2022-05-25 DIAGNOSIS — E669 Obesity, unspecified: Secondary | ICD-10-CM | POA: Diagnosis present

## 2022-05-25 DIAGNOSIS — I252 Old myocardial infarction: Secondary | ICD-10-CM | POA: Insufficient documentation

## 2022-05-25 MED ORDER — CARVEDILOL 3.125 MG PO TABS: 3.1250 mg | ORAL_TABLET | Freq: Two times a day (BID) | ORAL | 3 refills | Status: DC

## 2022-05-25 MED ORDER — ATORVASTATIN CALCIUM 40 MG PO TABS: 40.0000 mg | ORAL_TABLET | Freq: Every day | ORAL | 3 refills | Status: DC

## 2022-05-25 NOTE — Patient Instructions (Signed)
Medication Instructions:  Your physician recommends that you continue on your current medications as directed. Please refer to the Current Medication list given to you today.  *If you need a refill on your cardiac medications before your next appointment, please call your pharmacy*   Lab Work: none If you have labs (blood work) drawn today and your tests are completely normal, you will receive your results only by: MyChart Message (if you have MyChart) OR A paper copy in the mail If you have any lab test that is abnormal or we need to change your treatment, we will call you to review the results.   Testing/Procedures: none   Follow-Up: At [facility], you and your health needs are our priority.  As part of our continuing mission to provide you with exceptional heart care, we have created designated Provider Care Teams.  These Care Teams include your primary Cardiologist (physician) and Advanced Practice Providers (APPs -  Physician Assistants and Nurse Practitioners) who all work together to provide you with the care you need, when you need it.  We recommend signing up for the patient portal called "MyChart".  Sign up information is provided on this After Visit Summary.  MyChart is used to connect with patients for Virtual Visits (Telemedicine).  Patients are able to view lab/test results, encounter notes, upcoming appointments, etc.  Non-urgent messages can be sent to your provider as well.   To learn more about what you can do with MyChart, go to [email or web address].    Your next appointment:   12 month(s)  The format for your next appointment:   In Person  Provider:   Jayadeep Varanasi, MD     Other Instructions    Important Information About Sugar       

## 2022-10-15 ENCOUNTER — Other Ambulatory Visit: Payer: Self-pay

## 2022-10-15 ENCOUNTER — Emergency Department (HOSPITAL_BASED_OUTPATIENT_CLINIC_OR_DEPARTMENT_OTHER): Payer: Medicare Other | Admitting: Radiology

## 2022-10-15 ENCOUNTER — Emergency Department (HOSPITAL_BASED_OUTPATIENT_CLINIC_OR_DEPARTMENT_OTHER)
Admission: EM | Admit: 2022-10-15 | Discharge: 2022-10-15 | Disposition: A | Discharge status: Home / Self Care | Payer: Medicare Other | Attending: Emergency Medicine | Admitting: Emergency Medicine

## 2022-10-15 ENCOUNTER — Emergency Department (HOSPITAL_BASED_OUTPATIENT_CLINIC_OR_DEPARTMENT_OTHER): Payer: Medicare Other

## 2022-10-15 DIAGNOSIS — W108XXA Fall (on) (from) other stairs and steps, initial encounter: Secondary | ICD-10-CM | POA: Insufficient documentation

## 2022-10-15 DIAGNOSIS — Y9301 Activity, walking, marching and hiking: Secondary | ICD-10-CM | POA: Diagnosis not present

## 2022-10-15 DIAGNOSIS — Z23 Encounter for immunization: Secondary | ICD-10-CM | POA: Diagnosis not present

## 2022-10-15 DIAGNOSIS — S0181XA Laceration without foreign body of other part of head, initial encounter: Secondary | ICD-10-CM | POA: Diagnosis not present

## 2022-10-15 DIAGNOSIS — Y92512 Supermarket, store or market as the place of occurrence of the external cause: Secondary | ICD-10-CM | POA: Insufficient documentation

## 2022-10-15 DIAGNOSIS — S01111A Laceration without foreign body of right eyelid and periocular area, initial encounter: Secondary | ICD-10-CM | POA: Diagnosis not present

## 2022-10-15 DIAGNOSIS — Z7982 Long term (current) use of aspirin: Secondary | ICD-10-CM | POA: Insufficient documentation

## 2022-10-15 DIAGNOSIS — S0990XA Unspecified injury of head, initial encounter: Secondary | ICD-10-CM | POA: Diagnosis present

## 2022-10-15 DIAGNOSIS — S80211A Abrasion, right knee, initial encounter: Secondary | ICD-10-CM | POA: Diagnosis not present

## 2022-10-15 DIAGNOSIS — W19XXXA Unspecified fall, initial encounter: Secondary | ICD-10-CM

## 2022-10-15 DIAGNOSIS — S0101XA Laceration without foreign body of scalp, initial encounter: Secondary | ICD-10-CM

## 2022-10-15 MED ORDER — LIDOCAINE-EPINEPHRINE 2 %-1:100000 IJ SOLN
20.0000 mL | Freq: Once | INTRAMUSCULAR | Status: DC
  Filled 2022-10-15: qty 20.4

## 2022-10-15 MED ORDER — LIDOCAINE-EPINEPHRINE (PF) 2 %-1:200000 IJ SOLN
10.0000 mL | Freq: Once | INTRAMUSCULAR | Status: AC
  Administered 2022-10-15: 10 mL
  Filled 2022-10-15: qty 20

## 2022-10-15 MED ORDER — TETANUS-DIPHTH-ACELL PERTUSSIS 5-2.5-18.5 LF-MCG/0.5 IM SUSY
0.5000 mL | PREFILLED_SYRINGE | Freq: Once | INTRAMUSCULAR | Status: AC
  Administered 2022-10-15: 0.5 mL via INTRAMUSCULAR
  Filled 2022-10-15: qty 0.5

## 2022-10-15 NOTE — ED Triage Notes (Signed)
Pt had mechanical fall. Pt missed bottom step and fell forward hitting his head and right knee. Pt denis LOC, Pt denis N/V but states that he does have a headache.  A&O x 4 GCS 15, ambulatory.  Pt is not on blood thinners

## 2022-10-15 NOTE — ED Provider Notes (Signed)
..Laceration Repair  Date/Time: 10/15/2022 7:00 PM  Performed by: Chuck Hint, PA-C Authorized by: Chuck Hint, PA-C   Consent:    Consent obtained:  Verbal   Consent given by:  Patient   Risks, benefits, and alternatives were discussed: yes     Risks discussed:  Need for additional repair, nerve damage, infection, pain, poor wound healing and poor cosmetic result Anesthesia:    Anesthesia method:  Nerve block   Block needle gauge:  25 G   Block anesthetic:  Lidocaine 2% WITH epi   Block technique:  R supraorbital nerve block   Block outcome:  Anesthesia achieved Laceration details:    Location:  Face   Face location:  R eyebrow   Length (cm):  3   Depth (mm):  1 Treatment:    Area cleansed with:  Saline   Amount of cleaning:  Extensive   Irrigation solution:  Sterile saline   Irrigation volume:  500 mL   Irrigation method:  Syringe   Visualized foreign bodies/material removed: no     Debridement:  None Skin repair:    Repair method:  Sutures   Suture size:  5-0   Suture material:  Prolene   Number of sutures:  3 Approximation:    Approximation:  Close Repair type:    Repair type:  Simple Post-procedure details:    Dressing:  Bulky dressing   Procedure completion:  Tolerated .Marland KitchenLaceration Repair  Date/Time: 10/16/2022 7:34 AM  Performed by: Chuck Hint, PA-C Authorized by: Chuck Hint, PA-C   Anesthesia:    Anesthesia method:  Nerve block   Block location:  Supraorbital   Block needle gauge:  25 G   Block anesthetic:  Lidocaine 2% WITH epi   Block technique:  Supraorbital   Block outcome:  Anesthesia achieved Laceration details:    Location:  Face   Face location:  Forehead   Length (cm):  1   Depth (mm):  1 Treatment:    Area cleansed with:  Saline   Amount of cleaning:  Extensive   Irrigation solution:  Sterile saline   Irrigation volume:  500 mL   Irrigation method:  Syringe   Visualized foreign bodies/material removed: no      Debridement:  None Skin repair:    Repair method:  Sutures   Suture size:  5-0   Suture material:  Prolene   Number of sutures:  1 Approximation:    Approximation:  Close Repair type:    Repair type:  Simple Post-procedure details:    Dressing:  Bulky dressing   Procedure completion:  Tolerated .Marland KitchenLaceration Repair  Date/Time: 10/16/2022 7:35 AM  Performed by: Chuck Hint, PA-C Authorized by: Chuck Hint, PA-C   Anesthesia:    Anesthesia method:  Nerve block   Block needle gauge:  25 G   Block anesthetic:  Lidocaine 2% WITH epi   Block technique:  Supraorbital   Block outcome:  Anesthesia achieved Laceration details:    Location:  Face   Face location:  Forehead   Length (cm):  1   Depth (mm):  1 Treatment:    Area cleansed with:  Saline   Amount of cleaning:  Extensive   Irrigation solution:  Sterile saline   Irrigation volume:  500 mL   Irrigation method:  Syringe   Debridement:  None Skin repair:    Repair method:  Sutures   Suture size:  5-0   Suture material:  Prolene  Number of sutures:  1 Approximation:    Approximation:  Close Repair type:    Repair type:  Simple Post-procedure details:    Dressing:  Bulky dressing   Procedure completion:  Tolerated   I performed the nerve block however the student did the sutures. There were 3 lacerations (3 cm, 1 cm, and 1 cm) that were covered with the nerve block.   Chuck Hint, PA-C 10/15/22 1903    Lorelle Gibbs, DO 10/15/22 2357    Chuck Hint, PA-C 10/16/22 0739    Lorelle Gibbs, DO 10/16/22 1947

## 2022-10-15 NOTE — Discharge Instructions (Signed)
You have been seen and discharged from the emergency department.  Your CT imaging and x-ray imaging was normal.  Your tetanus was updated.  Keep sutures in place for 7 to 10 days.  Your primary doctor may remove them.  Keep the lacerations dry for the next 24 hours.  Keep antibiotic ointment on the wounds for the next 2 to 3 days, you then may let them heal to air.    Follow-up with your primary provider for further evaluation and further care. Take home medications as prescribed. If you have any worsening symptoms or further concerns for your health please return to an emergency department for further evaluation.

## 2022-10-15 NOTE — ED Provider Notes (Signed)
[facility] Provider Note   CSN: :[id number] Arrival date & time: 10/15/22  1246     History  Chief Complaint  Patient presents with   Lytle Michaels    LUISALFREDO SAIZ is a 82 y.o. male.  HPI   82 year old male presents emergency department with concern for mechanical fall.  Patient states he was walking out of [employer] when he missed the last bottom step and fell forward hitting his head and right knee.  No loss of consciousness, he is not on anticoagulation.  Complaining of mild headache but denies any nausea/vomiting.  No neck or back pain.  No other extremity pain.  He is otherwise been in his usual state of health, no syncope.  Home Medications Prior to Admission medications   Medication Sig Start Date End Date Taking? Authorizing Provider  aspirin 81 MG tablet Take 81 mg by mouth 3 (three) times a week. Take one tablet by mouth every M W F    [provider]  atorvastatin (LIPITOR) 40 MG tablet Take 1 tablet (40 mg total) by mouth daily. 05/25/22   Jettie Booze, MD  carvedilol (COREG) 3.125 MG tablet Take 1 tablet (3.125 mg total) by mouth 2 (two) times daily with a meal. 05/25/22   Jettie Booze, MD  cholecalciferol (VITAMIN D) 1000 UNITS tablet Take 1,000 Units by mouth daily.      [provider]  MAGNESIUM PO Take 1 tablet by mouth daily with supper.    [provider]  nitroGLYCERIN (NITROSTAT) 0.4 MG SL tablet Place 1 tablet (0.4 mg total) under the tongue every 5 (five) minutes x 3 doses as needed for chest pain. 05/05/20   Jettie Booze, MD  OMEPRAZOLE PO Take 1 tablet by mouth 3 (three) times a week. Take one tablet by mouth every M W F    [provider]      Allergies    Patient has no known allergies.    Review of Systems   Review of Systems  Respiratory:  Negative for shortness of breath.   Cardiovascular:  Negative for chest pain.  Gastrointestinal:  Negative for  abdominal pain.  Musculoskeletal:  Negative for back pain and neck pain.       + right knee pain  Skin:  Positive for wound.  Neurological:  Positive for headaches.    Physical Exam Updated Vital Signs BP (!) 155/83 (BP Location: Right Arm)   Pulse 65   Temp 98.4 F (36.9 C) (Oral)   Resp 18   Ht 5' 10"$  (1.778 m)   Wt 93 kg   SpO2 97%   BMI 29.41 kg/m  Physical Exam Vitals and nursing note reviewed.  Constitutional:      Appearance: Normal appearance.  HENT:     Head: Normocephalic.     Comments: 3 small linear lacerations just above the right eyebrow, bleeding controlled    Right Ear: External ear normal.     Left Ear: External ear normal.     Mouth/Throat:     Mouth: Mucous membranes are moist.     Comments: No intraoral injury Eyes:     Extraocular Movements: Extraocular movements intact.     Pupils: Pupils are equal, round, and reactive to light.  Cardiovascular:     Rate and Rhythm: Normal rate.  Pulmonary:     Effort: Pulmonary effort is normal. No respiratory distress.  Abdominal:     Palpations: Abdomen is soft.  Tenderness: There is no abdominal tenderness.  Musculoskeletal:     Cervical back: No rigidity or tenderness.     Comments: Abrasion to right knee  Skin:    General: Skin is warm.  Neurological:     Mental Status: He is alert and oriented to person, place, and time. Mental status is at baseline.  Psychiatric:        Mood and Affect: Mood normal.     ED Results / Procedures / Treatments   Labs (all labs ordered are listed, but only abnormal results are displayed) Labs Reviewed - No data to display  EKG None  Radiology CT Head Wo Contrast  Result Date: 10/15/2022 CLINICAL DATA:  Trauma. EXAM: CT HEAD WITHOUT CONTRAST CT MAXILLOFACIAL WITHOUT CONTRAST CT CERVICAL SPINE WITHOUT CONTRAST TECHNIQUE: Multidetector CT imaging of the head, cervical spine, and maxillofacial structures were performed using the standard protocol without  intravenous contrast. Multiplanar CT image reconstructions of the cervical spine and maxillofacial structures were also generated. RADIATION DOSE REDUCTION: This exam was performed according to the departmental dose-optimization program which includes automated exposure control, adjustment of the mA and/or kV according to patient size and/or use of iterative reconstruction technique. COMPARISON:  None Available. FINDINGS: CT HEAD FINDINGS Brain: Mild age-related atrophy and chronic microvascular ischemic changes. There is no acute intracranial hemorrhage. No mass effect or midline shift. No extra-axial fluid collection. Vascular: No hyperdense vessel or unexpected calcification. Skull: Normal. Negative for fracture or focal lesion. Other: Laceration of the skin or right forehead.  No large hematoma. CT MAXILLOFACIAL FINDINGS Osseous: No acute fracture or mandibular subluxation. Orbits: The globes and retro-orbital fat are preserved. Sinuses: Small right maxillary sinus retention cysts or polyps. The remainder of the visualized paranasal sinuses and mastoid air cells are clear. Soft tissues: Negative. CT CERVICAL SPINE FINDINGS Alignment: No acute subluxation. Skull base and vertebrae: No acute fracture. Osteopenia. Small lucency in the C3 and C5, nonspecific, and may be related to osteopenia. Metastatic disease is not excluded clinical correlation is recommended. Soft tissues and spinal canal: No prevertebral fluid or swelling. No visible canal hematoma. Disc levels:  No acute findings.  Degenerative changes. Upper chest: Negative. Other: Bilateral carotid bulb calcified plaques. IMPRESSION: 1. No acute intracranial pathology. Mild age-related atrophy and chronic microvascular ischemic changes. 2. No acute facial bone fractures. 3. No acute/traumatic cervical spine pathology. Electronically Signed   By: Anner Crete M.D.   On: 10/15/2022 18:21   CT Maxillofacial WO CM  Result Date: 10/15/2022 CLINICAL DATA:   Trauma. EXAM: CT HEAD WITHOUT CONTRAST CT MAXILLOFACIAL WITHOUT CONTRAST CT CERVICAL SPINE WITHOUT CONTRAST TECHNIQUE: Multidetector CT imaging of the head, cervical spine, and maxillofacial structures were performed using the standard protocol without intravenous contrast. Multiplanar CT image reconstructions of the cervical spine and maxillofacial structures were also generated. RADIATION DOSE REDUCTION: This exam was performed according to the departmental dose-optimization program which includes automated exposure control, adjustment of the mA and/or kV according to patient size and/or use of iterative reconstruction technique. COMPARISON:  None Available. FINDINGS: CT HEAD FINDINGS Brain: Mild age-related atrophy and chronic microvascular ischemic changes. There is no acute intracranial hemorrhage. No mass effect or midline shift. No extra-axial fluid collection. Vascular: No hyperdense vessel or unexpected calcification. Skull: Normal. Negative for fracture or focal lesion. Other: Laceration of the skin or right forehead.  No large hematoma. CT MAXILLOFACIAL FINDINGS Osseous: No acute fracture or mandibular subluxation. Orbits: The globes and retro-orbital fat are preserved. Sinuses: Small right maxillary sinus  retention cysts or polyps. The remainder of the visualized paranasal sinuses and mastoid air cells are clear. Soft tissues: Negative. CT CERVICAL SPINE FINDINGS Alignment: No acute subluxation. Skull base and vertebrae: No acute fracture. Osteopenia. Small lucency in the C3 and C5, nonspecific, and may be related to osteopenia. Metastatic disease is not excluded clinical correlation is recommended. Soft tissues and spinal canal: No prevertebral fluid or swelling. No visible canal hematoma. Disc levels:  No acute findings.  Degenerative changes. Upper chest: Negative. Other: Bilateral carotid bulb calcified plaques. IMPRESSION: 1. No acute intracranial pathology. Mild age-related atrophy and chronic  microvascular ischemic changes. 2. No acute facial bone fractures. 3. No acute/traumatic cervical spine pathology. Electronically Signed   By: Anner Crete M.D.   On: 10/15/2022 18:21   CT Cervical Spine Wo Contrast  Result Date: 10/15/2022 CLINICAL DATA:  Trauma. EXAM: CT HEAD WITHOUT CONTRAST CT MAXILLOFACIAL WITHOUT CONTRAST CT CERVICAL SPINE WITHOUT CONTRAST TECHNIQUE: Multidetector CT imaging of the head, cervical spine, and maxillofacial structures were performed using the standard protocol without intravenous contrast. Multiplanar CT image reconstructions of the cervical spine and maxillofacial structures were also generated. RADIATION DOSE REDUCTION: This exam was performed according to the departmental dose-optimization program which includes automated exposure control, adjustment of the mA and/or kV according to patient size and/or use of iterative reconstruction technique. COMPARISON:  None Available. FINDINGS: CT HEAD FINDINGS Brain: Mild age-related atrophy and chronic microvascular ischemic changes. There is no acute intracranial hemorrhage. No mass effect or midline shift. No extra-axial fluid collection. Vascular: No hyperdense vessel or unexpected calcification. Skull: Normal. Negative for fracture or focal lesion. Other: Laceration of the skin or right forehead.  No large hematoma. CT MAXILLOFACIAL FINDINGS Osseous: No acute fracture or mandibular subluxation. Orbits: The globes and retro-orbital fat are preserved. Sinuses: Small right maxillary sinus retention cysts or polyps. The remainder of the visualized paranasal sinuses and mastoid air cells are clear. Soft tissues: Negative. CT CERVICAL SPINE FINDINGS Alignment: No acute subluxation. Skull base and vertebrae: No acute fracture. Osteopenia. Small lucency in the C3 and C5, nonspecific, and may be related to osteopenia. Metastatic disease is not excluded clinical correlation is recommended. Soft tissues and spinal canal: No  prevertebral fluid or swelling. No visible canal hematoma. Disc levels:  No acute findings.  Degenerative changes. Upper chest: Negative. Other: Bilateral carotid bulb calcified plaques. IMPRESSION: 1. No acute intracranial pathology. Mild age-related atrophy and chronic microvascular ischemic changes. 2. No acute facial bone fractures. 3. No acute/traumatic cervical spine pathology. Electronically Signed   By: Anner Crete M.D.   On: 10/15/2022 18:21   DG Knee Complete 4 Views Right  Result Date: 10/15/2022 CLINICAL DATA:  Fall EXAM: RIGHT KNEE - COMPLETE 4 VIEW COMPARISON:  None Available. FINDINGS: No acute fracture, dislocation or subluxation. Calcification of the menisci consistent with chondrocalcinosis. No osteolytic or osteoblastic changes. No effusion identified. IMPRESSION: Meniscal calcifications consistent with chondrocalcinosis. No acute osseous abnormalities. Electronically Signed   By: Sammie Bench M.D.   On: 10/15/2022 14:23    Procedures Procedures    Medications Ordered in ED Medications  lidocaine-EPINEPHrine (XYLOCAINE W/EPI) 2 %-1:100000 (with pres) injection 20 mL (has no administration in time range)  lidocaine-EPINEPHrine (XYLOCAINE W/EPI) 2 %-1:200000 (PF) injection 10 mL (has no administration in time range)  Tdap (BOOSTRIX) injection 0.5 mL (0.5 mLs Intramuscular Given 10/15/22 1817)    ED Course/ Medical Decision Making/ A&P  Medical Decision Making Amount and/or Complexity of Data Reviewed Radiology: ordered.  Risk Prescription drug management.   82 year old male with mechanical fall, head injury and small lacerations above the right eyebrow as well as a right knee abrasion.  CT/x-ray imaging is negative.  Tetanus was updated.  Lacerations were repaired by PA, please see separate note.  Wounds will be dressed and patient will follow-up with primary doctor for reevaluation/suture removal.  Patient at this time appears safe  and stable for discharge and close outpatient follow up. Discharge plan and strict return to ED precautions discussed, patient verbalizes understanding and agreement.        Final Clinical Impression(s) / ED Diagnoses Final diagnoses:  None    Rx / DC Orders ED Discharge Orders     None         Lorelle Gibbs, DO 10/15/22 1958

## 2022-11-30 ENCOUNTER — Ambulatory Visit (INDEPENDENT_AMBULATORY_CARE_PROVIDER_SITE_OTHER): Payer: Medicare Other | Admitting: Podiatry

## 2022-11-30 ENCOUNTER — Encounter: Payer: Self-pay | Admitting: Podiatry

## 2022-11-30 DIAGNOSIS — L821 Other seborrheic keratosis: Secondary | ICD-10-CM | POA: Insufficient documentation

## 2022-11-30 DIAGNOSIS — M79674 Pain in right toe(s): Secondary | ICD-10-CM | POA: Diagnosis not present

## 2022-11-30 DIAGNOSIS — M79675 Pain in left toe(s): Secondary | ICD-10-CM

## 2022-11-30 DIAGNOSIS — M7752 Other enthesopathy of left foot: Secondary | ICD-10-CM | POA: Diagnosis not present

## 2022-11-30 DIAGNOSIS — B351 Tinea unguium: Secondary | ICD-10-CM

## 2022-11-30 DIAGNOSIS — H903 Sensorineural hearing loss, bilateral: Secondary | ICD-10-CM | POA: Insufficient documentation

## 2022-11-30 DIAGNOSIS — D2370 Other benign neoplasm of skin of unspecified lower limb, including hip: Secondary | ICD-10-CM | POA: Insufficient documentation

## 2022-11-30 NOTE — Progress Notes (Signed)
Subjective:   Patient ID: Angel West, male   DOB: 82 y.o.   MRN: [id number]   HPI Patient presents with painful elongated nails of both feet and concerns about big toe joint pain left first metatarsal that is been sore.  States that is been going on for a number years cannot take care of nails as he cannot see does not currently smoke likes to be active   Review of Systems  All other systems reviewed and are negative.       Objective:  Physical Exam Vitals and nursing note reviewed.  Constitutional:      Appearance: He is well-developed.  Pulmonary:     Effort: Pulmonary effort is normal.  Musculoskeletal:        General: Normal range of motion.  Skin:    General: Skin is warm.  Neurological:     Mental Status: He is alert.     Neurovascular status intact muscle strength adequate range of motion adequate with patient found to have inflammation of the first MPJ left fluid buildup around the joint surface painful when pressed and is also noted to have elongated thick nailbeds 1-5 both feet that are painful and make shoe gear difficult.  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

## 2023-03-22 ENCOUNTER — Ambulatory Visit: Payer: PRIVATE HEALTH INSURANCE | Admitting: Podiatry

## 2023-04-07 ENCOUNTER — Encounter: Payer: Self-pay | Admitting: Podiatry

## 2023-04-07 ENCOUNTER — Ambulatory Visit (INDEPENDENT_AMBULATORY_CARE_PROVIDER_SITE_OTHER): Payer: Medicare Other | Admitting: Podiatry

## 2023-04-07 DIAGNOSIS — B351 Tinea unguium: Secondary | ICD-10-CM

## 2023-04-07 DIAGNOSIS — M79674 Pain in right toe(s): Secondary | ICD-10-CM

## 2023-04-07 DIAGNOSIS — M79675 Pain in left toe(s): Secondary | ICD-10-CM | POA: Diagnosis not present

## 2023-04-07 NOTE — Progress Notes (Signed)
This patient presents to the office with chief complaint of long thick painful nails.  Patient says the nails are painful walking and wearing shoes.  This patient is unable to self treat.  This patient is unable to trim his  nails since he is unable to reach his  nails.  he presents to the office for preventative foot care services.  General Appearance  Alert, conversant and in no acute stress.  Vascular  Dorsalis pedis and posterior tibial  pulses are palpable  bilaterally.  Capillary return is within normal limits  bilaterally. Temperature is within normal limits  bilaterally.  Neurologic  Senn-Weinstein monofilament wire test within normal limits  bilaterally. Muscle power within normal limits bilaterally.  Nails Thick disfigured discolored nails with subungual debris  from hallux to fifth toes bilaterally. No evidence of bacterial infection or drainage bilaterally.  Orthopedic  No limitations of motion  feet .  No crepitus or effusions noted.  No bony pathology or digital deformities noted.  Skin  normotropic skin with no porokeratosis noted bilaterally.  No signs of infections or ulcers noted.     Onychomycosis  Nails  B/L.  Pain in right toes  Pain in left toes  Debridement of nails both feet followed trimming the nails with dremel tool.    RTC 3 months.   Gardiner Barefoot DPM

## 2023-06-09 ENCOUNTER — Ambulatory Visit (INDEPENDENT_AMBULATORY_CARE_PROVIDER_SITE_OTHER): Payer: Medicare Other | Admitting: Podiatry

## 2023-06-09 ENCOUNTER — Encounter: Payer: Self-pay | Admitting: Podiatry

## 2023-06-09 DIAGNOSIS — B351 Tinea unguium: Secondary | ICD-10-CM | POA: Diagnosis not present

## 2023-06-09 DIAGNOSIS — M79675 Pain in left toe(s): Secondary | ICD-10-CM

## 2023-06-09 DIAGNOSIS — M79674 Pain in right toe(s): Secondary | ICD-10-CM

## 2023-06-09 NOTE — Progress Notes (Signed)
This patient presents to the office with chief complaint of long thick painful nails.  Patient says the nails are painful walking and wearing shoes.  This patient is unable to self treat.  This patient is unable to trim his  nails since he is unable to reach his  nails.  he presents to the office for preventative foot care services.  General Appearance  Alert, conversant and in no acute stress.  Vascular  Dorsalis pedis and posterior tibial  pulses are palpable  bilaterally.  Capillary return is within normal limits  bilaterally. Temperature is within normal limits  bilaterally.  Neurologic  Senn-Weinstein monofilament wire test within normal limits  bilaterally. Muscle power within normal limits bilaterally.  Nails Thick disfigured discolored nails with subungual debris  from hallux to fifth toes bilaterally. No evidence of bacterial infection or drainage bilaterally.  Orthopedic  No limitations of motion  feet .  No crepitus or effusions noted.  No bony pathology or digital deformities noted.  Skin  normotropic skin with no porokeratosis noted bilaterally.  No signs of infections or ulcers noted.     Onychomycosis  Nails  B/L.  Pain in right toes  Pain in left toes  Debridement of nails both feet followed trimming the nails with dremel tool.    RTC 3 months.   Gardiner Barefoot DPM

## 2023-06-27 ENCOUNTER — Ambulatory Visit: Payer: Medicare Other | Admitting: Interventional Cardiology

## 2023-07-27 ENCOUNTER — Other Ambulatory Visit: Payer: Self-pay | Admitting: Interventional Cardiology

## 2023-08-02 ENCOUNTER — Ambulatory Visit: Payer: Medicare Other | Attending: Interventional Cardiology | Admitting: Emergency Medicine

## 2023-08-02 ENCOUNTER — Ambulatory Visit (INDEPENDENT_AMBULATORY_CARE_PROVIDER_SITE_OTHER): Payer: Medicare Other

## 2023-08-02 ENCOUNTER — Encounter: Payer: Self-pay | Admitting: Physician Assistant

## 2023-08-02 VITALS — BP 121/60 | HR 70 | Ht 70.0 in | Wt 220.2 lb

## 2023-08-02 DIAGNOSIS — R0602 Shortness of breath: Secondary | ICD-10-CM

## 2023-08-02 DIAGNOSIS — I25119 Atherosclerotic heart disease of native coronary artery with unspecified angina pectoris: Secondary | ICD-10-CM | POA: Insufficient documentation

## 2023-08-02 DIAGNOSIS — I493 Ventricular premature depolarization: Secondary | ICD-10-CM

## 2023-08-02 DIAGNOSIS — E785 Hyperlipidemia, unspecified: Secondary | ICD-10-CM | POA: Insufficient documentation

## 2023-08-02 DIAGNOSIS — R0609 Other forms of dyspnea: Secondary | ICD-10-CM | POA: Insufficient documentation

## 2023-08-02 DIAGNOSIS — I10 Essential (primary) hypertension: Secondary | ICD-10-CM | POA: Diagnosis present

## 2023-08-02 NOTE — Patient Instructions (Addendum)
Medication Instructions:  Your physician recommends that you continue on your current medications as directed. Please refer to the Current Medication list given to you today.  *If you need a refill on your cardiac medications before your next appointment, please call your pharmacy*   Lab Work: TODAY:  BMET, CBC, PRO BNP, & TSH If you have labs (blood work) drawn today and your tests are completely normal, you will receive your results only by: MyChart Message (if you have MyChart) OR A paper copy in the mail If you have any lab test that is abnormal or we need to change your treatment, we will call you to review the results.   Testing/Procedures: Your physician has requested that you have an echocardiogram. Echocardiography is a painless test that uses sound waves to create images of your heart. It provides your doctor with information about the size and shape of your heart and how well your heart's chambers and valves are working. This procedure takes approximately one hour. There are no restrictions for this procedure. Please do NOT wear cologne, perfume, aftershave, or lotions (deodorant is allowed). Please arrive 15 minutes prior to your appointment time.  Please note: We ask at that you not bring children with you during ultrasound (echo/ vascular) testing. Due to room size and safety concerns, children are not allowed in the ultrasound rooms during exams. Our front office staff cannot provide observation of children in our lobby area while testing is being conducted. An adult accompanying a patient to their appointment will only be allowed in the ultrasound room at the discretion of the ultrasound technician under special circumstances. We apologize for any inconvenience.   Your physician has requested that you have a lexiscan myoview. For further information please visit [email or web address] Please follow instruction sheet, BELOW:    You are scheduled for a Myocardial Perfusion  Imaging Study Please arrive 15 minutes prior to your appointment time for registration and insurance purposes.  The test will take approximately 3 to 4 hours to complete; you may bring reading material.  If someone comes with you to your appointment, they will need to remain in the main lobby due to limited space in the testing area. **If you are pregnant or breastfeeding, please notify the nuclear lab prior to your appointment**  How to prepare for your Myocardial Perfusion Test: Do not eat or drink 3 hours prior to your test, except you may have water. Do not consume products containing caffeine (regular or decaffeinated) 12 hours prior to your test. (ex: coffee, chocolate, sodas, tea). Do bring a list of your current medications with you.  If not listed below, you may take your medications as normal. Do wear comfortable clothes (no dresses or overalls) and walking shoes, tennis shoes preferred (No heels or open toe shoes are allowed). Do NOT wear cologne, perfume, aftershave, or lotions (deodorant is allowed). If these instructions are not followed, your test will have to be rescheduled.    ZIO XT- Long Term Monitor Instructions  Your physician has requested you wear a ZIO patch monitor for 3 days.  This is a single patch monitor. [employer] supplies one patch monitor per enrollment. Additional stickers are not available. Please do not apply patch if you will be having a Nuclear Stress Test,  Echocardiogram, Cardiac CT, MRI, or Chest Xray during the period you would be wearing the  monitor. The patch cannot be worn during these tests. You cannot remove and re-apply the  ZIO XT patch monitor.  Your ZIO  patch monitor will be mailed 3 day USPS to your address on file. It may take 3-5 days  to receive your monitor after you have been enrolled.  Once you have received your monitor, please review the enclosed instructions. Your monitor  has already been registered assigning a specific monitor  serial # to you.  Billing and Patient Assistance Program Information  We have supplied [employer] with any of your insurance information on file for billing purposes. [employer] offers a sliding scale Patient Assistance Program for patients that do not have  insurance, or whose insurance does not completely cover the cost of the ZIO monitor.  You must apply for the Patient Assistance Program to qualify for this discounted rate.  To apply, please call [employer] at [phone], select option 4, select option 2, ask to apply for  Patient Assistance Program. Meredeth Ide will ask your household income, and how many people  are in your household. They will quote your out-of-pocket cost based on that information.  [employer] will also be able to set up a 58-month, interest-free payment plan if needed.  Applying the monitor   Shave hair from upper left chest.  Hold abrader disc by orange tab. Rub abrader in 40 strokes over the upper left chest as  indicated in your monitor instructions.  Clean area with 4 enclosed alcohol pads. Let dry.  Apply patch as indicated in monitor instructions. Patch will be placed under collarbone on left  side of chest with arrow pointing upward.  Rub patch adhesive wings for 2 minutes. Remove white label marked "1". Remove the white  label marked "2". Rub patch adhesive wings for 2 additional minutes.  While looking in a mirror, press and release button in center of patch. A small green light will  flash 3-4 times. This will be your only indicator that the monitor has been turned on.  Do not shower for the first 24 hours. You may shower after the first 24 hours.  Press the button if you feel a symptom. You will hear a small click. Record Date, Time and  Symptom in the Patient Logbook.  When you are ready to remove the patch, follow instructions on the last 2 pages of Patient  Logbook. Stick patch monitor onto the last page of Patient Logbook.  Place Patient Logbook in the blue  and white box. Use locking tab on box and tape box closed  securely. The blue and white box has prepaid postage on it. Please place it in the mailbox as  soon as possible. Your physician should have your test results approximately 7 days after the  monitor has been mailed back to [facility].  Call [facility] Customer Care at [phone] if you have questions regarding  your ZIO XT patch monitor. Call them immediately if you see an orange light blinking on your  monitor.  If your monitor falls off in less than 4 days, contact our Monitor department at [phone].  If your monitor becomes loose or falls off after 4 days call [employer] at [phone] for  suggestions on securing your monitor    Follow-Up: At [facility], you and your health needs are our priority.  As part of our continuing mission to provide you with exceptional heart care, we have created designated Provider Care Teams.  These Care Teams include your primary Cardiologist (physician) and Advanced Practice Providers (APPs -  Physician Assistants and Nurse Practitioners) who all work together to provide you with the care you need, when you  need it.  We recommend signing up for the patient portal called "MyChart".  Sign up information is provided on this After Visit Summary.  MyChart is used to connect with patients for Virtual Visits (Telemedicine).  Patients are able to view lab/test results, encounter notes, upcoming appointments, etc.  Non-urgent messages can be sent to your provider as well.   To learn more about what you can do with MyChart, go to [email or web address].    Your next appointment:   AFTER TESTING IS COMPLETED  Provider:   Orbie Pyo, MD   OR Tereso Newcomer, PA-C  Other Instructions

## 2023-08-02 NOTE — Progress Notes (Signed)
Cardiology Office Note:    Date:  08/02/2023  ID:  Angel West, DOB 1940-09-21, MRN [id number] PCP: Ralene Ok, MD  [facility] Providers Cardiologist:  Orbie Pyo, MD        Patient Profile:      Angel West is a 82 year old with history of CAD, hypertension, hyperlipidemia, obesity  2015 he presented with an inferior STEMI, LHC performed on 04/08/2014 where he received BMS to his mRCA.  Mild disease in LAD and branches, mild disease in left circumflex and its branches.  Normal LV SF.  He was last seen by Dr. Eldridge Dace on 05/25/2022.  He was doing well at the time no medication changes indicated.      History of Present Illness:  Angel West is a 82 y.o. male who returns for his 1 year follow-up.  Unfortunately patient has not been doing well over the last 4 to 6 months.  He has noted increased SOB and new onset DOE.  He notes he has always had some mild SOB over the last several years however now when he is exercising or walks up a hill he has trouble catching his breath or having conversations.  He denies orthopnea or PND.  He notes that he does have an ongoing cough, the cough is dry, and he tells me his wife has noted him wheezing at times.  He notes "my chest has just felt really congested. "He has gained about 13 pounds since his last visit, patient attributes this to his diet.  He notes that he generally does not eat healthy.  He notes ongoing chronic and persistent ankle edem (L greater than R).  He denied swelling to his calves.  He did have 1 fall this year where he was sent to the ED for evaluation.  He denies chest pain, hemoptysis, dizziness, lightheadedness, syncope, near syncope, palpitations.        Review of Systems  Constitutional: Positive for weight gain. Negative for weight loss.  Cardiovascular:  Positive for dyspnea on exertion and leg swelling. Negative for chest pain, claudication, irregular heartbeat, near-syncope, orthopnea,  palpitations, paroxysmal nocturnal dyspnea and syncope.  Respiratory:  Positive for cough, shortness of breath and wheezing. Negative for hemoptysis.   Gastrointestinal:  Negative for bloating, abdominal pain, hematochezia and melena.  Genitourinary:  Negative for hematuria.     See HPI    Studies Reviewed:       CATH: Patent left main coronary artery. Mild disease in the left anterior descending artery and its branches. Mild disease in the left circumflex artery and its branches. Occluded mid right coronary artery. This was the culprit for today's presentation. This was successfully treated with a 3.0 x 18 vision bare-metal stent, postdilated to 3.6 mm in diameter. A bare-metal stent was chosen because the patient has several invasive procedures including esophageal stretching later this month. Normal left ventricular systolic function. LVEDP 10 mmHg. Ejection fraction 55 %.  Physical Exam:   VS:  BP 121/60   Pulse 70   Ht 5\' 10"  (1.778 m)   Wt 220 lb 3.2 oz (99.9 kg)   SpO2 97%   BMI 31.60 kg/m    Wt Readings from Last 3 Encounters:  08/02/23 220 lb 3.2 oz (99.9 kg)  10/15/22 205 lb (93 kg)  05/25/22 207 lb 9.6 oz (94.2 kg)    Constitutional:      Appearance: Normal and healthy appearance.  HENT:     Head: Normocephalic.  Neck:  Vascular: No carotid bruit or JVD. JVD normal.  Pulmonary:     Effort: Pulmonary effort is normal.     Breath sounds: Normal breath sounds.  Chest:     Chest wall: Not tender to palpatation.  Cardiovascular:     PMI at left midclavicular line. Normal rate. Regular rhythm. Premature beats Normal S1. Normal S2.      Murmurs: There is no murmur.     No gallop.  No click. No rub.  Pulses:    Intact distal pulses.  Edema:    Ankle: bilateral 1+ edema of the ankle.    Feet: bilateral 1+ edema of the feet. Abdominal:     General: There is no distension.  Musculoskeletal:     Cervical back: Neck supple. Skin:    General: Skin is warm.   Neurological:     General: No focal deficit present.     Mental Status: Oriented to person, place and time.  Psychiatric:        Behavior: Behavior is cooperative.       Assessment and Plan:  Shortness of Breath / DOE  -New onset SOB, DOE, weight gain, cough, wheezing over the last 6 months.  Has been ongoing steadily and not improving.  Lungs clear to auscultation -He did have a chest x-ray in 2015 showing probable COPD however this was never followed up on -He has no recent history of CV testing given he has been mostly asymptomatic since STEMI in 2015 -Plan for echocardiogram to assess LV function and for valvular abnormalities -Plan for [facility] for ischemic evaluation -BNP, BMP, CBC to be completed today  Coronary artery disease -S/p inferior STEMI w/ LHC on 04/08/2014 where he received BMS to his mRCA.  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  He has had frequent PVCs on the EKG however the past 1-2 years -Plan for 3-day heart monitor to assess PVC burden given his noted symptoms above -Will order TSH today  Hypertension -BP today 131/60, well-controlled -Continue carvedilol 3.125 twice daily -Encouraged exercise regimen   Hyperlipidemia -LDL 44 02/04/2022, excellent -Plan to address fasting lipid panel on follow-up -Encouraged increasing fiber, vegetables, fruits in diet.  Decreasing foods high in sugar and processed foods            Informed Consent   Shared Decision Making/Informed Consent The risks [chest pain, shortness of breath, cardiac arrhythmias, dizziness, blood pressure fluctuations, myocardial infarction, stroke/transient ischemic attack, nausea, vomiting, allergic reaction, radiation exposure, metallic taste sensation  and life-threatening complications (estimated to be 1 in 10,000)], benefits (risk stratification, diagnosing coronary artery disease, treatment guidance) and alternatives of a nuclear stress test were discussed in detail with Angel West and he agrees to proceed.     Dispo:  Return in about 4-6 weeks.  Signed, Denyce Robert, NP

## 2023-08-02 NOTE — Progress Notes (Unsigned)
Enrolled patient for a 3 day Zio XT monitor to be mailed to patients home   Thukkani to read

## 2023-08-03 LAB — CBC
Hematocrit: 42.7 % (ref 37.5–51.0)
Hemoglobin: 13.9 g/dL (ref 13.0–17.7)
MCH: 30.9 pg (ref 26.6–33.0)
MCHC: 32.6 g/dL (ref 31.5–35.7)
MCV: 95 fL (ref 79–97)
Platelets: 164 10*3/uL (ref 150–450)
RBC: 4.5 x10E6/uL (ref 4.14–5.80)
RDW: 12.2 % (ref 11.6–15.4)
WBC: 6.8 10*3/uL (ref 3.4–10.8)

## 2023-08-03 LAB — BASIC METABOLIC PANEL
BUN/Creatinine Ratio: 16 (ref 10–24)
BUN: 19 mg/dL (ref 8–27)
CO2: 23 mmol/L (ref 20–29)
Calcium: 9.5 mg/dL (ref 8.6–10.2)
Chloride: 103 mmol/L (ref 96–106)
Creatinine, Ser: 1.19 mg/dL (ref 0.76–1.27)
Glucose: 83 mg/dL (ref 70–99)
Potassium: 4.9 mmol/L (ref 3.5–5.2)
Sodium: 142 mmol/L (ref 134–144)
eGFR: 60 mL/min/{1.73_m2} (ref >59)

## 2023-08-03 LAB — PRO B NATRIURETIC PEPTIDE: NT-Pro BNP: 581 pg/mL — ABNORMAL HIGH (ref 0–486)

## 2023-08-03 LAB — TSH: TSH: 2.07 u[IU]/mL (ref 0.450–4.500)

## 2023-08-07 NOTE — Progress Notes (Signed)
Pt has been made aware of normal result and verbalized understanding.  jw

## 2023-08-08 ENCOUNTER — Encounter (HOSPITAL_COMMUNITY): Payer: Self-pay

## 2023-08-08 DIAGNOSIS — R0609 Other forms of dyspnea: Secondary | ICD-10-CM

## 2023-08-08 DIAGNOSIS — I493 Ventricular premature depolarization: Secondary | ICD-10-CM | POA: Diagnosis not present

## 2023-08-08 DIAGNOSIS — R0602 Shortness of breath: Secondary | ICD-10-CM | POA: Diagnosis not present

## 2023-08-08 DIAGNOSIS — I25119 Atherosclerotic heart disease of native coronary artery with unspecified angina pectoris: Secondary | ICD-10-CM

## 2023-08-16 ENCOUNTER — Ambulatory Visit (HOSPITAL_COMMUNITY): Payer: Medicare Other | Attending: Cardiology

## 2023-08-16 DIAGNOSIS — R0602 Shortness of breath: Secondary | ICD-10-CM | POA: Diagnosis present

## 2023-08-16 DIAGNOSIS — I25119 Atherosclerotic heart disease of native coronary artery with unspecified angina pectoris: Secondary | ICD-10-CM | POA: Insufficient documentation

## 2023-08-16 DIAGNOSIS — R0609 Other forms of dyspnea: Secondary | ICD-10-CM | POA: Diagnosis present

## 2023-08-16 DIAGNOSIS — I493 Ventricular premature depolarization: Secondary | ICD-10-CM | POA: Insufficient documentation

## 2023-08-16 LAB — MYOCARDIAL PERFUSION IMAGING
LV dias vol: 93 mL (ref 62–150)
LV sys vol: 54 mL
Nuc Stress EF: 42 %
Peak HR: 83 {beats}/min
Rest HR: 66 {beats}/min
Rest Nuclear Isotope Dose: 10.7 mCi
SDS: 0
SRS: 0
SSS: 0
ST Depression (mm): 0 mm
Stress Nuclear Isotope Dose: 29.8 mCi
TID: 0.97

## 2023-08-16 MED ORDER — TECHNETIUM TC 99M TETROFOSMIN IV KIT
29.8000 | PACK | Freq: Once | INTRAVENOUS | Status: AC | PRN
  Administered 2023-08-16: 29.8 via INTRAVENOUS

## 2023-08-16 MED ORDER — REGADENOSON 0.4 MG/5ML IV SOLN
0.4000 mg | Freq: Once | INTRAVENOUS | Status: AC
  Administered 2023-08-16: 0.4 mg via INTRAVENOUS

## 2023-08-16 MED ORDER — TECHNETIUM TC 99M TETROFOSMIN IV KIT
10.7000 | PACK | Freq: Once | INTRAVENOUS | Status: AC | PRN
  Administered 2023-08-16: 10.7 via INTRAVENOUS

## 2023-08-18 ENCOUNTER — Telehealth: Payer: Self-pay

## 2023-08-18 DIAGNOSIS — I493 Ventricular premature depolarization: Secondary | ICD-10-CM

## 2023-08-18 MED ORDER — METOPROLOL SUCCINATE ER 50 MG PO TB24: 50.0000 mg | ORAL_TABLET | Freq: Every day | ORAL | 3 refills | Status: DC

## 2023-08-18 NOTE — Telephone Encounter (Signed)
-----   Message from [place] sent at 08/18/2023  2:18 PM EST ----- Please contact Angel West: His heart monitor showed frequent premature ventricular contractions (PVCs) that were approximately 20% of his beats.  These are an extra beat from the bottom chambers of his heart and since his are frequent it can cause him to feel bad.  We are going to change his carvedilol 3.125mg  BID to Toprol-XL 50mg  once daily.  Please discontinue his carvedilol.  This medication change will help improve his extra beats.  Please refer him to our electrophysiology (EP) team for ongoing treatment of his PVCs. Please have him come to the lab to have his magnesium drawn as abnormal levels can cause frequent PVCs.  Please let him know we will follow-up with further recommendations after his scheduled echocardiogram.  Wyn Forster, DNP

## 2023-08-18 NOTE — Telephone Encounter (Signed)
The patient has been notified of the result and verbalized understanding.  All questions (if any) were answered. Cindi Carbon [place], RN 08/18/2023 5:21 PM    Placed orders for referral to EP and get Mg level next week. Discontinued his coreg and sent Toprol 50 mg by mouth daily to his pharmacy of choice.

## 2023-08-22 ENCOUNTER — Other Ambulatory Visit: Payer: Self-pay

## 2023-08-22 DIAGNOSIS — I493 Ventricular premature depolarization: Secondary | ICD-10-CM

## 2023-08-22 LAB — MAGNESIUM: Magnesium: 2.3 mg/dL (ref 1.6–2.3)

## 2023-09-04 ENCOUNTER — Other Ambulatory Visit: Payer: Self-pay

## 2023-09-04 MED ORDER — ATORVASTATIN CALCIUM 40 MG PO TABS: 40.0000 mg | ORAL_TABLET | Freq: Every day | ORAL | 3 refills | Status: DC

## 2023-09-08 ENCOUNTER — Ambulatory Visit (HOSPITAL_COMMUNITY): Payer: Medicare Other | Attending: Emergency Medicine

## 2023-09-08 DIAGNOSIS — I25119 Atherosclerotic heart disease of native coronary artery with unspecified angina pectoris: Secondary | ICD-10-CM

## 2023-09-08 DIAGNOSIS — I493 Ventricular premature depolarization: Secondary | ICD-10-CM | POA: Diagnosis not present

## 2023-09-08 DIAGNOSIS — R0609 Other forms of dyspnea: Secondary | ICD-10-CM | POA: Diagnosis not present

## 2023-09-08 DIAGNOSIS — R0602 Shortness of breath: Secondary | ICD-10-CM | POA: Diagnosis not present

## 2023-09-08 LAB — ECHOCARDIOGRAM COMPLETE
Area-P 1/2: 2.17 cm2
Calc EF: 50.7 %
S' Lateral: 3.3 cm
Single Plane A2C EF: 47.3 %
Single Plane A4C EF: 53.6 %

## 2023-09-11 ENCOUNTER — Ambulatory Visit (INDEPENDENT_AMBULATORY_CARE_PROVIDER_SITE_OTHER): Payer: Medicare Other | Admitting: Podiatry

## 2023-09-11 ENCOUNTER — Encounter: Payer: Self-pay | Admitting: Podiatry

## 2023-09-11 VITALS — Ht 70.0 in | Wt 220.0 lb

## 2023-09-11 DIAGNOSIS — B351 Tinea unguium: Secondary | ICD-10-CM

## 2023-09-11 DIAGNOSIS — M79674 Pain in right toe(s): Secondary | ICD-10-CM

## 2023-09-11 DIAGNOSIS — M79675 Pain in left toe(s): Secondary | ICD-10-CM

## 2023-09-11 NOTE — Progress Notes (Signed)
 This patient presents to the office with chief complaint of long thick painful nails.  Patient says the nails are painful walking and wearing shoes.  This patient is unable to self treat.  This patient is unable to trim his  nails since he is unable to reach his nails.  he presents to the office for preventative foot care services.  General Appearance  Alert, conversant and in no acute stress.  Vascular  Dorsalis pedis and posterior tibial  pulses are palpable  bilaterally.  Capillary return is within normal limits  bilaterally. Temperature is within normal limits  bilaterally.  Neurologic  Senn-Weinstein monofilament wire test within normal limits  bilaterally. Muscle power within normal limits bilaterally.  Nails Thick disfigured discolored nails with subungual debris  from hallux to fifth toes bilaterally. No evidence of bacterial infection or drainage bilaterally.  Orthopedic  No limitations of motion  feet .  No crepitus or effusions noted.  No bony pathology or digital deformities noted.  Skin  normotropic skin with no porokeratosis noted bilaterally.  No signs of infections or ulcers noted.     Onychomycosis  Nails  B/L.  Pain in right toes  Pain in left toes  Debridement of nails both feet followed trimming the nails with dremel tool.    RTC 3 months.   Helane Gunther DPM

## 2023-09-15 LAB — LAB REPORT - SCANNED: EGFR: 61

## 2023-10-10 NOTE — Progress Notes (Signed)
 Electrophysiology Office Note:    Date:  10/11/2023   ID:  ABHIRAJ DOZAL, DOB Jul 10, 1941, MRN [id number]  [facility] Cardiologist:  Lurena MARLA Red, MD  [facility] Electrophysiologist:  OLE ONEIDA HOLTS, MD   Referring MD: Rana Lum CROME, NP   Chief Complaint: PVCs  History of Present Illness:     Mr. Abby is an 82 year old man who I am seeing today for an evaluation of PVCs at the request of Lum Rana, NP.  The patient last saw [facility] in clinic August 02, 2023.  The patient has a history of coronary artery disease with prior STEMI in 2015, frequent PVCs, hypertension, hyperlipidemia.  At the last appointment with [facility], exertional shortness of breath was reported and a heart monitor was ordered.  The heart monitor showed a 20% burden of PVCs and he was referred to EP for treatment considerations.  Today he is with his wife in clinic.  He tells me that he does not feel the palpitations/skipped beats.  He reports fatigue that is chronic.  No problems with his metoprolol .  No syncope or presyncope.     Their past medical, social and family history was reviewed.   ROS:   Please see the history of present illness.    All other systems reviewed and are negative.  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  PVCs have a left superior axis and are relatively narrow. EKG Interpretation Date/Time:  Wednesday October 11 2023 11:06:12 EST Ventricular Rate:  73 PR Interval:  220 QRS Duration:  144 QT Interval:  420 QTC Calculation: 462 R Axis:   -45  Text Interpretation: Sinus rhythm with 1st degree A-V block with Premature atrial complexes  and Premature ventricular complexes or Fusion complexes Right bundle branch block Left anterior fascicular block Bifascicular block Confirmed by Holts Ole [phone]) on 10/11/2023 11:55:47 AM    Physical Exam:    VS:  BP 124/84   Pulse 73   Ht 5' 10 (1.778 m)   Wt 223 lb (101.2 kg)   SpO2 97%   BMI 32.00 kg/m     Wt Readings from Last 3 Encounters:  10/11/23 223 lb (101.2 kg)  09/11/23 220 lb (99.8 kg)  08/16/23 220 lb (99.8 kg)     GEN: no distress CARD: RRR, No MRG RESP: No IWOB. CTAB.        ASSESSMENT AND PLAN:    1. PVC (premature ventricular contraction)   2. 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  His EF is normal. He currently takes metoprolol  succinate 50 mg by mouth once daily.   I discussed treatment options for the patient.  I discussed intensifying his medical therapy with antiarrhythmic drug therapy but I think the risks of off target effects outweigh any benefit.  Recommend continuing beta-blocker and routine follow-up with general cardiology.  I do not think a catheter ablation is a good idea for Mr. Abby given his age, comorbidities and normal ejection fraction.  #Coronary artery disease Continue aspirin  and statin.  No ischemic symptoms today  Follow-up with EP on an as-needed basis.  Signed, Ole DASEN. Cindie, MD, [facility], [facility] 10/11/2023 12:05 PM    Electrophysiology [facility]

## 2023-10-11 ENCOUNTER — Encounter: Payer: Self-pay | Admitting: Cardiology

## 2023-10-11 ENCOUNTER — Ambulatory Visit: Payer: Medicare Other | Attending: Cardiology | Admitting: Cardiology

## 2023-10-11 VITALS — BP 124/84 | HR 73 | Ht 70.0 in | Wt 223.0 lb

## 2023-10-11 DIAGNOSIS — I493 Ventricular premature depolarization: Secondary | ICD-10-CM

## 2023-10-11 DIAGNOSIS — I25119 Atherosclerotic heart disease of native coronary artery with unspecified angina pectoris: Secondary | ICD-10-CM | POA: Diagnosis not present

## 2023-10-11 NOTE — Patient Instructions (Signed)
 Medication Instructions:  Your physician recommends that you continue on your current medications as directed. Please refer to the Current Medication list given to you today.  *If you need a refill on your cardiac medications before your next appointment, please call your pharmacy*  Follow-Up: At [facility], you and your health needs are our priority.  As part of our continuing mission to provide you with exceptional heart care, we have created designated Provider Care Teams.  These Care Teams include your primary Cardiologist (physician) and Advanced Practice Providers (APPs -  Physician Assistants and Nurse Practitioners) who all work together to provide you with the care you need, when you need it.  Your next appointment:   As needed with EP

## 2023-10-17 ENCOUNTER — Ambulatory Visit: Payer: Medicare Other | Admitting: Physician Assistant

## 2023-12-11 ENCOUNTER — Encounter: Payer: Self-pay | Admitting: Podiatry

## 2023-12-11 ENCOUNTER — Ambulatory Visit (INDEPENDENT_AMBULATORY_CARE_PROVIDER_SITE_OTHER): Payer: Medicare Other | Admitting: Podiatry

## 2023-12-11 DIAGNOSIS — M79675 Pain in left toe(s): Secondary | ICD-10-CM

## 2023-12-11 DIAGNOSIS — M79674 Pain in right toe(s): Secondary | ICD-10-CM | POA: Diagnosis not present

## 2023-12-11 DIAGNOSIS — B351 Tinea unguium: Secondary | ICD-10-CM | POA: Diagnosis not present

## 2023-12-11 NOTE — Progress Notes (Signed)
 This patient presents to the office with chief complaint of long thick painful nails.  Patient says the nails are painful walking and wearing shoes.  This patient is unable to self treat.  This patient is unable to trim his  nails since he is unable to reach his nails.  he presents to the office for preventative foot care services.  General Appearance  Alert, conversant and in no acute stress.  Vascular  Dorsalis pedis and posterior tibial  pulses are palpable  bilaterally.  Capillary return is within normal limits  bilaterally. Temperature is within normal limits  bilaterally.  Neurologic  Senn-Weinstein monofilament wire test within normal limits  bilaterally. Muscle power within normal limits bilaterally.  Nails Thick disfigured discolored nails with subungual debris  from hallux to fifth toes bilaterally. No evidence of bacterial infection or drainage bilaterally.  Orthopedic  No limitations of motion  feet .  No crepitus or effusions noted.  No bony pathology or digital deformities noted.  Skin  normotropic skin with no porokeratosis noted bilaterally.  No signs of infections or ulcers noted.     Onychomycosis  Nails  B/L.  Pain in right toes  Pain in left toes  Debridement of nails both feet followed trimming the nails with dremel tool.    RTC 3 months.   Helane Gunther DPM

## 2024-01-08 ENCOUNTER — Encounter: Payer: Self-pay | Admitting: Physician Assistant

## 2024-01-08 ENCOUNTER — Ambulatory Visit: Payer: Medicare Other | Attending: Physician Assistant | Admitting: Physician Assistant

## 2024-01-08 VITALS — BP 130/76 | HR 76 | Ht 70.0 in | Wt 217.0 lb

## 2024-01-08 DIAGNOSIS — I493 Ventricular premature depolarization: Secondary | ICD-10-CM | POA: Diagnosis present

## 2024-01-08 DIAGNOSIS — I2510 Atherosclerotic heart disease of native coronary artery without angina pectoris: Secondary | ICD-10-CM | POA: Diagnosis present

## 2024-01-08 DIAGNOSIS — E785 Hyperlipidemia, unspecified: Secondary | ICD-10-CM | POA: Insufficient documentation

## 2024-01-08 DIAGNOSIS — I10 Essential (primary) hypertension: Secondary | ICD-10-CM | POA: Diagnosis present

## 2024-01-08 DIAGNOSIS — I5189 Other ill-defined heart diseases: Secondary | ICD-10-CM | POA: Insufficient documentation

## 2024-01-08 NOTE — Assessment & Plan Note (Signed)
 History of inferior STEMI in 2015, treated with a bare metal stent to the RCA. Recent nuclear stress test in 08/2023 showed no ischemia.  Echocardiogram demonstrated normal EF.  He has not had chest discomfort to suggest angina. - Continue aspirin  81 mg daily - Continue Lipitor  40 mg daily

## 2024-01-08 NOTE — Progress Notes (Addendum)
 Cardiology Office Note:    Date:  01/08/2024  ID:  REMBERT NAUGLE, DOB 1941-05-27, MRN [id number] PCP: Edda Goo, MD  [facility] Providers Cardiologist:  Kyra Phy, MD Electrophysiologist:  Boyce Byes, MD       Patient Profile:      Coronary artery disease  Inferior STEMI in 2015 s/p 3 x 18 mm Vision BMS to RCA  LHC 04/08/2014: Mid LAD mild atherosclerosis; mid LCx mild diffuse atherosclerosis; mid RCA 100; EF 55 MPI 08/16/2023: Inferior infarct, no ischemia, EF 42, PVCs, intermediate risk PVCs Monitor 08/2023: NSR, PVCs 20.9% TTE 09/08/2023: EF 55-60, no RWMA, GR 1 DD, normal RVSF, normal PASP, mild LAE, trivial MR Eval by EP 10/2023 (Dr. Lambert)>> med Rx with beta-blocker; follow up w EP prn Hypertension Hyperlipidemia Chronic Obstructive Pulmonary Disease Prostate CA       Discussed the use of AI scribe software for clinical note transcription with the patient, who gave verbal consent to proceed.  History of Present Illness Angel West is a 83 y.o. male who returns for follow up of CAD, PVCs. He was last seen by Palmer Bobo, NP in 07/2023. The pt had symptoms of shortness of breath and was set up for Echocardiogram and MPI. BNP was mildly elevated but within limits for his age. No changes were made in his med Rx. MPI demonstrated old infarct but no ischemia. EF was mildly low and there were frequent PVCs. Echocardiogram showed normal EF. A monitor showed 20% PVCs. He was seen by Dr. Marven Slimmer for EP evaluation. It was felt that the patient should remain on med Rx (no AAD or ablation) with beta-blocker and follow up with EP as needed.   He is here alone.  He experiences shortness of breath, particularly when squatting or bending over. No chest heaviness, pressure, leg swelling, difficulty breathing when lying flat, dizziness, or syncope. He reports a lack of energy and difficulty losing weight, despite dietary changes. He has been trying to  lose weight by eating breakfast at 10 AM, lunch, and dinner before 5-6 PM, and avoiding late-night snacks. He has lost a couple of pounds recently. No significant changes in weight or appetite, although he notes a general disinterest in food.   ROS-See HPI    Studies Reviewed:        Results Recent Labs    08/02/23 1448  K 4.9  BUN 19  CREATININE 1.19  EGFR 60  TSH 2.070  PROBNP 581*  HGB 13.9       Risk Assessment/Calculations:             Physical Exam:   VS:  BP 130/76 (BP Location: Left Arm, Patient Position: Sitting, Cuff Size: Large)   Pulse 76   Ht 5\' 10"  (1.778 m)   Wt 217 lb (98.4 kg)   SpO2 97%   BMI 31.14 kg/m    Wt Readings from Last 3 Encounters:  01/08/24 217 lb (98.4 kg)  10/11/23 223 lb (101.2 kg)  09/11/23 220 lb (99.8 kg)    Constitutional:      Appearance: Healthy appearance. Not in distress.  Neck:     Vascular: No JVR. JVD normal.  Pulmonary:     Breath sounds: Normal breath sounds. No wheezing. No rales.  Cardiovascular:     Normal rate. Regular rhythm.     Murmurs: There is no murmur.  Edema:    Peripheral edema present.    Ankle: trace edema of the right  ankle. Abdominal:     Palpations: Abdomen is soft.       Assessment and Plan:   Assessment & Plan Coronary artery disease involving native coronary artery of native heart without angina pectoris History of inferior STEMI in 2015, treated with a bare metal stent to the RCA. Recent nuclear stress test in 08/2023 showed no ischemia.  Echocardiogram demonstrated normal EF.  He has not had chest discomfort to suggest angina. - Continue aspirin  81 mg daily - Continue Lipitor  40 mg daily Diastolic dysfunction Grade 1 diastolic dysfunction noted on recent echocardiogram.  BNP was minimally elevated but well within range for his age.  He does report symptoms of bendopnea.  Otherwise he has no signs or symptoms of congestive heart failure.  At this point, I do not recommend placing him on  daily diuretic therapy.  We discussed the importance of monitoring weights and when to contact us  for symptoms that would be concerning for congestive heart failure. PVC (premature ventricular contraction) 20.9% burden by monitor in December 2024. Evaluated by EP, Dr. Marven Slimmer, who recommended continuing beta blocker therapy. Asymptomatic from PVCs.  EF is normal by echocardiogram.   - Continue metoprolol  succinate 50 mg daily - Follow up with EP as needed Essential hypertension Blood pressure well-controlled.  Continue metoprolol  succinate 50 mg daily. Hyperlipidemia LDL goal <70 No recent LDL on file.  LDL goal less than 70.  Continue Atorvastatin  40 mg daily.  Request most recent lipid panel from primary care.  Addendum: Labs from PCP dated 04/03/23 personally reviewed and interpreted by me 01/08/2024: TC 109, Trig 125, HDL 29, LDL 57. LDL at goal. Continue Atorvastatin  40 mg once daily.       Dispo:  Return in about 1 year (around 01/07/2025) for Routine Follow Up w/ Palmer Bobo, NP or Dr. Lorie Rook.  Signed, Marlyse Single, PA-C

## 2024-01-08 NOTE — Patient Instructions (Signed)
 Medication Instructions:  Your physician recommends that you continue on your current medications as directed. Please refer to the Current Medication list given to you today.  *If you need a refill on your cardiac medications before your next appointment, please call your pharmacy*  Lab Work: None ordered  If you have labs (blood work) drawn today and your tests are completely normal, you will receive your results only by: MyChart Message (if you have MyChart) OR A paper copy in the mail If you have any lab test that is abnormal or we need to change your treatment, we will call you to review the results.  Testing/Procedures None ordered  Follow-Up: At [facility], you and your health needs are our priority.  As part of our continuing mission to provide you with exceptional heart care, our providers are all part of one team.  This team includes your primary Cardiologist (physician) and Advanced Practice Providers or APPs (Physician Assistants and Nurse Practitioners) who all work together to provide you with the care you need, when you need it.  Your next appointment:   12 month(s)  Provider:   Arun K Thukkani, MD or Palmer Bobo, NP          We recommend signing up for the patient portal called "MyChart".  Sign up information is provided on this After Visit Summary.  MyChart is used to connect with patients for Virtual Visits (Telemedicine).  Patients are able to view lab/test results, encounter notes, upcoming appointments, etc.  Non-urgent messages can be sent to your provider as well.   To learn more about what you can do with MyChart, go to [email or web address].   Other Instructions

## 2024-03-11 ENCOUNTER — Ambulatory Visit (INDEPENDENT_AMBULATORY_CARE_PROVIDER_SITE_OTHER): Admitting: Podiatry

## 2024-03-11 ENCOUNTER — Encounter: Payer: Self-pay | Admitting: Podiatry

## 2024-03-11 DIAGNOSIS — B351 Tinea unguium: Secondary | ICD-10-CM | POA: Diagnosis not present

## 2024-03-11 DIAGNOSIS — M79674 Pain in right toe(s): Secondary | ICD-10-CM

## 2024-03-11 DIAGNOSIS — M79675 Pain in left toe(s): Secondary | ICD-10-CM

## 2024-03-11 NOTE — Progress Notes (Signed)
 This patient presents to the office with chief complaint of long thick painful nails.  Patient says the nails are painful walking and wearing shoes.  This patient is unable to self treat.  This patient is unable to trim his  nails since he is unable to reach his nails.  he presents to the office for preventative foot care services.  General Appearance  Alert, conversant and in no acute stress.  Vascular  Dorsalis pedis and posterior tibial  pulses are palpable  bilaterally.  Capillary return is within normal limits  bilaterally. Temperature is within normal limits  bilaterally.  Neurologic  Senn-Weinstein monofilament wire test within normal limits  bilaterally. Muscle power within normal limits bilaterally.  Nails Thick disfigured discolored nails with subungual debris  from hallux to fifth toes bilaterally. No evidence of bacterial infection or drainage bilaterally.  Orthopedic  No limitations of motion  feet .  No crepitus or effusions noted.  No bony pathology or digital deformities noted.  Skin  normotropic skin with no porokeratosis noted bilaterally.  No signs of infections or ulcers noted.     Onychomycosis  Nails  B/L.  Pain in right toes  Pain in left toes  Debridement of nails both feet followed trimming the nails with dremel tool.    RTC 3 months.   Helane Gunther DPM

## 2024-06-12 ENCOUNTER — Ambulatory Visit: Admitting: Podiatry

## 2024-06-12 ENCOUNTER — Encounter: Payer: Self-pay | Admitting: Podiatry

## 2024-06-12 DIAGNOSIS — B351 Tinea unguium: Secondary | ICD-10-CM

## 2024-06-12 DIAGNOSIS — M79674 Pain in right toe(s): Secondary | ICD-10-CM | POA: Diagnosis not present

## 2024-06-12 DIAGNOSIS — M79675 Pain in left toe(s): Secondary | ICD-10-CM

## 2024-06-12 NOTE — Progress Notes (Addendum)
 This patient presents to the office with chief complaint of long thick painful nails.  Patient says the nails are painful walking and wearing shoes.  This patient is unable to self treat.  This patient is unable to trim his nails since he is unable to reach his nails.  he presents to the office for preventative foot care services.  General Appearance  Alert, conversant and in no acute stress.  Vascular  Dorsalis pedis and posterior tibial  pulses are palpable  bilaterally.  Capillary return is within normal limits  bilaterally. Temperature is within normal limits  bilaterally.  Neurologic  Senn-Weinstein monofilament wire test within normal limits  bilaterally. Muscle power within normal limits bilaterally.  Nails Thick disfigured discolored nails with subungual debris  from hallux to fifth toes bilaterally. No evidence of bacterial infection or drainage bilaterally.  Orthopedic  No limitations of motion  feet .  No crepitus or effusions noted.  No bony pathology or digital deformities noted.  Skin  normotropic skin with no porokeratosis noted bilaterally.  No signs of infections or ulcers noted.     Onychomycosis  Nails  B/L.  Pain in right toes  Pain in left toes  Debridement of nails both feet followed trimming the nails with dremel tool.    RTC 3 months.   Cordella Bold DPM tomma

## 2024-08-09 ENCOUNTER — Other Ambulatory Visit: Payer: Self-pay | Admitting: Emergency Medicine

## 2024-08-29 ENCOUNTER — Other Ambulatory Visit: Payer: Self-pay | Admitting: Emergency Medicine

## 2024-09-12 ENCOUNTER — Ambulatory Visit: Admitting: Podiatry

## 2024-09-12 ENCOUNTER — Encounter: Payer: Self-pay | Admitting: Podiatry

## 2024-09-12 DIAGNOSIS — B351 Tinea unguium: Secondary | ICD-10-CM | POA: Diagnosis not present

## 2024-09-12 DIAGNOSIS — M79675 Pain in left toe(s): Secondary | ICD-10-CM | POA: Diagnosis not present

## 2024-09-12 DIAGNOSIS — M79674 Pain in right toe(s): Secondary | ICD-10-CM

## 2024-09-12 NOTE — Progress Notes (Signed)
 This patient presents to the office with chief complaint of long thick painful nails.  Patient says the nails are painful walking and wearing shoes.  This patient is unable to self treat.  This patient is unable to trim his nails since he is unable to reach his nails.  he presents to the office for preventative foot care services.  General Appearance  Alert, conversant and in no acute stress.  Vascular  Dorsalis pedis and posterior tibial  pulses are palpable  bilaterally.  Capillary return is within normal limits  bilaterally. Temperature is within normal limits  bilaterally.  Neurologic  Senn-Weinstein monofilament wire test within normal limits  bilaterally. Muscle power within normal limits bilaterally.  Nails Thick disfigured discolored nails with subungual debris  from hallux to fifth toes bilaterally. No evidence of bacterial infection or drainage bilaterally.  Orthopedic  No limitations of motion  feet .  No crepitus or effusions noted.  No bony pathology or digital deformities noted.  Skin  normotropic skin with no porokeratosis noted bilaterally.  No signs of infections or ulcers noted.     Onychomycosis  Nails  B/L.  Pain in right toes  Pain in left toes  Debridement of nails both feet followed trimming the nails with dremel tool.    RTC 10 weeks    Cordella Bold [facility]  bertell

## 2024-12-11 ENCOUNTER — Ambulatory Visit: Admitting: Podiatry
